# Patient Record
Sex: Female | Born: 1980 | Race: Black or African American | Hispanic: No | Marital: Single | Smoking: Current every day smoker
Health system: Southern US, Community
[De-identification: ages and names within clinical notes are randomized; demographics above are authoritative.]

## PROBLEM LIST (undated history)

## (undated) ENCOUNTER — Ambulatory Visit (HOSPITAL_COMMUNITY): Disposition: A | Discharge status: Home / Self Care | Payer: Medicare Other

## (undated) DIAGNOSIS — F819 Developmental disorder of scholastic skills, unspecified: Secondary | ICD-10-CM

## (undated) DIAGNOSIS — R569 Unspecified convulsions: Secondary | ICD-10-CM

## (undated) HISTORY — PX: TUBAL LIGATION: SHX77

---

## 2018-10-24 ENCOUNTER — Other Ambulatory Visit: Payer: Self-pay

## 2018-10-24 ENCOUNTER — Encounter (HOSPITAL_COMMUNITY): Payer: Self-pay

## 2018-10-24 ENCOUNTER — Emergency Department (HOSPITAL_COMMUNITY): Payer: Medicare Other

## 2018-10-24 ENCOUNTER — Observation Stay (HOSPITAL_COMMUNITY)
Admission: EM | Admit: 2018-10-24 | Discharge: 2018-10-25 | Disposition: A | Discharge status: Home / Self Care | Payer: Medicare Other | Attending: Internal Medicine | Admitting: Internal Medicine

## 2018-10-24 DIAGNOSIS — F17210 Nicotine dependence, cigarettes, uncomplicated: Secondary | ICD-10-CM | POA: Insufficient documentation

## 2018-10-24 DIAGNOSIS — T426X5A Adverse effect of other antiepileptic and sedative-hypnotic drugs, initial encounter: Secondary | ICD-10-CM | POA: Insufficient documentation

## 2018-10-24 DIAGNOSIS — Z79899 Other long term (current) drug therapy: Secondary | ICD-10-CM | POA: Diagnosis not present

## 2018-10-24 DIAGNOSIS — T426X1A Poisoning by other antiepileptic and sedative-hypnotic drugs, accidental (unintentional), initial encounter: Secondary | ICD-10-CM | POA: Diagnosis present

## 2018-10-24 DIAGNOSIS — R2681 Unsteadiness on feet: Secondary | ICD-10-CM | POA: Diagnosis not present

## 2018-10-24 DIAGNOSIS — G92 Toxic encephalopathy: Secondary | ICD-10-CM | POA: Diagnosis not present

## 2018-10-24 DIAGNOSIS — G40909 Epilepsy, unspecified, not intractable, without status epilepticus: Secondary | ICD-10-CM | POA: Diagnosis not present

## 2018-10-24 DIAGNOSIS — G9340 Encephalopathy, unspecified: Secondary | ICD-10-CM | POA: Diagnosis not present

## 2018-10-24 DIAGNOSIS — F819 Developmental disorder of scholastic skills, unspecified: Secondary | ICD-10-CM | POA: Insufficient documentation

## 2018-10-24 HISTORY — DX: Unspecified convulsions: R56.9

## 2018-10-24 HISTORY — DX: Developmental disorder of scholastic skills, unspecified: F81.9

## 2018-10-24 LAB — POC URINE PREG, ED: Preg Test, Ur: NEGATIVE

## 2018-10-24 LAB — URINALYSIS, ROUTINE W REFLEX MICROSCOPIC
Bacteria, UA: NONE SEEN
Bilirubin Urine: NEGATIVE
Glucose, UA: NEGATIVE mg/dL
Hgb urine dipstick: NEGATIVE
Ketones, ur: 20 mg/dL — AB
Leukocytes, UA: NEGATIVE
Nitrite: NEGATIVE
PROTEIN: 30 mg/dL — AB
Specific Gravity, Urine: 1.029 (ref 1.005–1.030)
pH: 5 (ref 5.0–8.0)

## 2018-10-24 LAB — COMPREHENSIVE METABOLIC PANEL
ALT: 14 U/L (ref 0–44)
AST: 17 U/L (ref 15–41)
Albumin: 3.3 g/dL — ABNORMAL LOW (ref 3.5–5.0)
Alkaline Phosphatase: 35 U/L — ABNORMAL LOW (ref 38–126)
Anion gap: 11 (ref 5–15)
BUN: 17 mg/dL (ref 6–20)
CO2: 23 mmol/L (ref 22–32)
Calcium: 9.3 mg/dL (ref 8.9–10.3)
Chloride: 107 mmol/L (ref 98–111)
Creatinine, Ser: 1.1 mg/dL — ABNORMAL HIGH (ref 0.44–1.00)
GFR calc Af Amer: >60 mL/min (ref >60)
GFR calc non Af Amer: >60 mL/min (ref >60)
Glucose, Bld: 110 mg/dL — ABNORMAL HIGH (ref 70–99)
Potassium: 3.7 mmol/L (ref 3.5–5.1)
SODIUM: 141 mmol/L (ref 135–145)
Total Bilirubin: 0.3 mg/dL (ref 0.3–1.2)
Total Protein: 7.3 g/dL (ref 6.5–8.1)

## 2018-10-24 LAB — CBC WITH DIFFERENTIAL/PLATELET
Abs Immature Granulocytes: 0.03 10*3/uL (ref 0.00–0.07)
Basophils Absolute: 0 10*3/uL (ref 0.0–0.1)
Basophils Relative: 0 %
Eosinophils Absolute: 0 10*3/uL (ref 0.0–0.5)
Eosinophils Relative: 0 %
HCT: 45.3 % (ref 36.0–46.0)
Hemoglobin: 14 g/dL (ref 12.0–15.0)
Immature Granulocytes: 0 %
Lymphocytes Relative: 14 %
Lymphs Abs: 1.2 10*3/uL (ref 0.7–4.0)
MCH: 30 pg (ref 26.0–34.0)
MCHC: 30.9 g/dL (ref 30.0–36.0)
MCV: 97.2 fL (ref 80.0–100.0)
Monocytes Absolute: 0.7 10*3/uL (ref 0.1–1.0)
Monocytes Relative: 8 %
NEUTROS PCT: 78 %
NRBC: 0 % (ref 0.0–0.2)
Neutro Abs: 6.8 10*3/uL (ref 1.7–7.7)
Platelets: 112 10*3/uL — ABNORMAL LOW (ref 150–400)
RBC: 4.66 MIL/uL (ref 3.87–5.11)
RDW: 14.6 % (ref 11.5–15.5)
WBC: 8.7 10*3/uL (ref 4.0–10.5)

## 2018-10-24 LAB — ETHANOL: Alcohol, Ethyl (B): <10 mg/dL (ref <10)

## 2018-10-24 LAB — RAPID URINE DRUG SCREEN, HOSP PERFORMED
Amphetamines: NOT DETECTED
Barbiturates: NOT DETECTED
Benzodiazepines: NOT DETECTED
Cocaine: NOT DETECTED
Opiates: NOT DETECTED
Tetrahydrocannabinol: NOT DETECTED

## 2018-10-24 LAB — CBG MONITORING, ED: Glucose-Capillary: 96 mg/dL (ref 70–99)

## 2018-10-24 LAB — CARBAMAZEPINE LEVEL, TOTAL: Carbamazepine Lvl: 10.2 ug/mL (ref 4.0–12.0)

## 2018-10-24 LAB — VALPROIC ACID LEVEL: VALPROIC ACID LVL: 111 ug/mL — AB (ref 50.0–100.0)

## 2018-10-24 MED ORDER — SODIUM CHLORIDE 0.9 % IV BOLUS
1000.0000 mL | Freq: Once | INTRAVENOUS | Status: AC
  Administered 2018-10-24: 1000 mL via INTRAVENOUS

## 2018-10-24 NOTE — ED Triage Notes (Signed)
Patient's sister reports that the patient had a seizure 2 days ago. Patient's sister reports that patient's medication has been changed x 4 months. Patient's sister reports that the patient is able to bathe herself, eat, etc, but in the past two days, has slurred speech, incontinent of urine, unable to walk.

## 2018-10-24 NOTE — ED Provider Notes (Signed)
[facility]-EMERGENCY DEPT Provider Note   CSN: [id number] Arrival date & time: 10/24/18  1831     History   Chief Complaint Chief Complaint  Patient presents with  . Aphasia  . Seizures    HPI Monica Snow is a 37 y.o. female.  The history is provided by the patient and a relative. No language interpreter was used.  Seizures     Monica Snow is a 37 y.o. female who presents to the Emergency Department complaining of aphasia. She presents to the emergency department accompanied by her sister for evaluation of altered mental status. She has a history of seizure disorder and takes Tegretol and Depakote. She has had seizures since the age of 35. A few months ago she came to live with her sister in [place]. Her sister picked up her medications on Thursday and noticed that the doses were different. Before she was on carbamazepine 200 mg twice daily and Depakote 750 mg twice daily. When she picked up the medications she is now on carbamazepine 500 mg twice daily and Depakote 750 mg twice daily. Over the last several days Monica Snow has experienced increased somnolence with difficulty in walking, slurred speech, drooling and urinary and bowel incontinence. Monica Snow reports feeling not well with upset stomach for an unclear period of time. She denies any additional complaints. Sister denies any seizures. Monica Snow does have a history of learning disability and went through the 10th grade. At baseline she is highly functional and helps her sister around the house. Past Medical History:  Diagnosis Date  . Learning disability   . Seizures [facility])     Patient Active Problem List   Diagnosis Date Noted  . 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 Seizure disorder (HCC) 10/24/2018  . Acute encephalopathy 10/24/2018    Past Surgical History:  Procedure Laterality Date  . TUBAL LIGATION       OB History   No obstetric history on file.      Home  Medications    Prior to Admission medications   Medication Sig Start Date End Date Taking? Authorizing Provider  carbamazepine (TEGRETOL) 200 MG tablet Take 400 mg by mouth 2 (two) times daily.   Yes [provider]  divalproex (DEPAKOTE) 250 MG DR tablet Take 750 mg by mouth 2 (two) times daily.   Yes [provider]  carbamazepine (TEGRETOL XR) 100 MG 12 hr tablet Take 100 mg by mouth 2 (two) times daily.    [provider]  levETIRAcetam (KEPPRA) 500 MG tablet Take 500 mg by mouth 2 (two) times daily.    [provider]    Family History Family History  Problem Relation Age of Onset  . Brain cancer Mother   . Seizures Father   . Hypertension Father   . Stroke Neg Hx   . Diabetes Neg Hx     Social History Social History   Tobacco Use  . Smoking status: Current Every Day Smoker    Packs/day: 0.25    Types: Cigarettes  . Smokeless tobacco: Never Used  Substance Use Topics  . Alcohol use: Never    Frequency: Never  . Drug use: Never     Allergies   Patient has no known allergies.   Review of Systems Review of Systems  Neurological: Positive for seizures.  All other systems reviewed and are negative.    Physical Exam Updated Vital Signs BP 125/75 (BP Location: Right Arm)   Pulse 70  Temp 98.5 F (36.9 C) (Oral)   Resp 15   Ht 5\' 4"  (1.626 m)   Wt 52.2 kg   LMP 10/24/2018   SpO2 100%   BMI 19.74 kg/m   Physical Exam Vitals signs and nursing note reviewed.  Constitutional:      Appearance: She is well-developed.  HENT:     Head: Normocephalic and atraumatic.  Cardiovascular:     Rate and Rhythm: Normal rate and regular rhythm.     Heart sounds: No murmur.  Pulmonary:     Effort: Pulmonary effort is normal. No respiratory distress.     Breath sounds: Normal breath sounds.  Abdominal:     Palpations: Abdomen is soft.     Tenderness: There is no abdominal tenderness. There is no guarding or rebound.   Musculoskeletal:        General: No tenderness.  Skin:    General: Skin is warm and dry.  Neurological:     Mental Status: She is alert.     Comments: Drowsy but arouses to verbal stimuli. Slow to answer questions. Moderately confused. No facial asymmetry. Five out of five strength in all four extremities.  Psychiatric:        Behavior: Behavior normal.      ED Treatments / Results  Labs (all labs ordered are listed, but only abnormal results are displayed) Labs Reviewed  COMPREHENSIVE METABOLIC PANEL - Abnormal; Notable for the following components:      Result Value   Glucose, Bld 110 (*)    Creatinine, Ser 1.10 (*)    Albumin 3.3 (*)    Alkaline Phosphatase 35 (*)    All other components within normal limits  URINALYSIS, ROUTINE W REFLEX MICROSCOPIC - Abnormal; Notable for the following components:   Ketones, ur 20 (*)    Protein, ur 30 (*)    All other components within normal limits  CBC WITH DIFFERENTIAL/PLATELET - Abnormal; Notable for the following components:   Platelets 112 (*)    All other components within normal limits  VALPROIC ACID LEVEL - Abnormal; Notable for the following components:   Valproic Acid Lvl 111 (*)    All other components within normal limits  ETHANOL  RAPID URINE DRUG SCREEN, HOSP PERFORMED  CARBAMAZEPINE LEVEL, TOTAL  HIV ANTIBODY (ROUTINE TESTING W REFLEX)  MAGNESIUM  PHOSPHORUS  TSH  COMPREHENSIVE METABOLIC PANEL  CBC  POC URINE PREG, ED  CBG MONITORING, ED    EKG None  Radiology Ct Head Wo Contrast  Result Date: 10/24/2018 CLINICAL DATA:  Altered mental status EXAM: CT HEAD WITHOUT CONTRAST TECHNIQUE: Contiguous axial images were obtained from the base of the skull through the vertex without intravenous contrast. COMPARISON:  None. FINDINGS: Brain: Cerebellar atrophy. No acute intracranial abnormality. Specifically, no hemorrhage, hydrocephalus, mass lesion, acute infarction, or significant intracranial injury. Vascular: No  hyperdense vessel or unexpected calcification. Skull: No acute calvarial abnormality. Sinuses/Orbits: Visualized paranasal sinuses and mastoids clear. Orbital soft tissues unremarkable. Other: None IMPRESSION: Cerebellar atrophy.  No acute intracranial abnormality. Electronically Signed   By: Charlett Nose M.D.   On: 10/24/2018 20:00    Procedures Procedures (including critical care time)  Medications Ordered in ED Medications  carbamazepine (TEGRETOL XR) 12 hr tablet 200 mg (has no administration in time range)  acetaminophen (TYLENOL) tablet 650 mg (has no administration in time range)    Or  acetaminophen (TYLENOL) suppository 650 mg (has no administration in time range)  ondansetron (ZOFRAN) tablet 4 mg (has no administration  in time range)    Or  ondansetron (ZOFRAN) injection 4 mg (has no administration in time range)  0.9 %  sodium chloride infusion (has no administration in time range)  sodium chloride 0.9 % bolus 1,000 mL (0 mLs Intravenous Stopped 10/24/18 2141)     Initial Impression / Assessment and Plan / ED Course  I have reviewed the triage vital signs and the nursing notes.  Pertinent labs & imaging results that were available during my care of the patient were reviewed by me and considered in my medical decision making (see chart for details).     Patient with history of seizure disorder here for evaluation of increased lethargy, drooling, weakness and incontinence that is been present for the last several days after increased dose in her seizure medications. She is non-toxic appearing on evaluation, confused but nonfocal. There is concern that she is a super therapeutic on her seizure medications contributing to her encephalopathy. Hospitalist consulted for admission for observation.  Final Clinical Impressions(s) / ED Diagnoses   Final diagnoses:  None    ED Discharge Orders    None       Tilden Fossaees, Esmee Fallaw, MD 10/25/18 0102

## 2018-10-24 NOTE — ED Notes (Addendum)
[id number] Eduard Closiffanie (sister)  [phone] Lowella Bandyikki [facility](Aunt)   Call if anything is needed.

## 2018-10-24 NOTE — ED Notes (Signed)
Pt informed that a urine sample is still needed, but pt stated "I can't pee right now".

## 2018-10-24 NOTE — ED Notes (Signed)
ED TO INPATIENT HANDOFF REPORT  Name/Age/Gender Monica Snow 37 y.o. female  Code Status   Home/SNF/Other Home  Chief Complaint Seizures;Aphasia  Level of Care/Admitting Diagnosis ED Disposition    ED Disposition Condition Comment   Admit  Hospital Area: [facility] [[id number]]  Level of Care: Telemetry [5]  Admit to tele based on following criteria: Monitor QTC interval  Diagnosis: Valproic acid toxicity [308657]  Admitting Physician: Therisa Doyne [[id number]]  Attending Physician: Therisa Doyne [[id number]]  PT Class (Do Not Modify): Observation [104]  PT Acc Code (Do Not Modify): Observation [10022]       Medical History Past Medical History:  Diagnosis Date  . Learning disability   . Seizures (HCC)     Allergies No Known Allergies  IV Location/Drains/Wounds Patient Lines/Drains/Airways Status   Active Line/Drains/Airways    Name:   Placement date:   Placement time:   Site:   Days:   Peripheral IV 10/24/18 Right Antecubital   10/24/18    1944    Antecubital   less than 1   External Urinary Catheter   10/24/18    1931    -   less than 1          Labs/Imaging Results for orders placed or performed during the hospital encounter of 10/24/18 (from the past 48 hour(s))  CBG monitoring, ED     Status: None   Collection Time: 10/24/18  7:17 PM  Result Value Ref Range   Glucose-Capillary 96 70 - 99 mg/dL  Comprehensive metabolic panel     Status: Abnormal   Collection Time: 10/24/18  7:32 PM  Result Value Ref Range   Sodium 141 135 - 145 mmol/L   Potassium 3.7 3.5 - 5.1 mmol/L   Chloride 107 98 - 111 mmol/L   CO2 23 22 - 32 mmol/L   Glucose, Bld 110 (H) 70 - 99 mg/dL   BUN 17 6 - 20 mg/dL   Creatinine, Ser 8.46 (H) 0.44 - 1.00 mg/dL   Calcium 9.3 8.9 - 96.2 mg/dL   Total Protein 7.3 6.5 - 8.1 g/dL   Albumin 3.3 (L) 3.5 - 5.0 g/dL   AST 17 15 - 41 U/L   ALT 14 0 - 44 U/L   Alkaline Phosphatase 35 (L) 38 - 126 U/L   Total  Bilirubin 0.3 0.3 - 1.2 mg/dL   GFR calc non Af Amer >60 >60 mL/min   GFR calc Af Amer >60 >60 mL/min   Anion gap 11 5 - 15    Comment: Performed at [facility], [address] [place], [place] [address]  Ethanol     Status: None   Collection Time: 10/24/18  7:32 PM  Result Value Ref Range   Alcohol, Ethyl (B) <10 <10 mg/dL    Comment: (NOTE) Lowest detectable limit for serum alcohol is 10 mg/dL. For medical purposes only. Performed at [facility], [address], [place], [place] [address]   Urinalysis, Routine w reflex microscopic     Status: Abnormal   Collection Time: 10/24/18  7:32 PM  Result Value Ref Range   Color, Urine YELLOW YELLOW   APPearance CLEAR CLEAR   Specific Gravity, Urine 1.029 1.005 - 1.030   pH 5.0 5.0 - 8.0   Glucose, UA NEGATIVE NEGATIVE mg/dL   Hgb urine dipstick NEGATIVE NEGATIVE   Bilirubin Urine NEGATIVE NEGATIVE   Ketones, ur 20 (A) NEGATIVE mg/dL   Protein, ur 30 (A) NEGATIVE mg/dL  Nitrite NEGATIVE NEGATIVE   Leukocytes, UA NEGATIVE NEGATIVE   RBC / HPF 0-5 0 - 5 RBC/hpf   WBC, UA 0-5 0 - 5 WBC/hpf   Bacteria, UA NONE SEEN NONE SEEN   Squamous Epithelial / LPF 0-5 0 - 5   Mucus PRESENT     Comment: Performed at [facility], [address], [place], [place] [address]  Urine rapid drug screen (hosp performed)     Status: None   Collection Time: 10/24/18  7:32 PM  Result Value Ref Range   Opiates NONE DETECTED NONE DETECTED   Cocaine NONE DETECTED NONE DETECTED   Benzodiazepines NONE DETECTED NONE DETECTED   Amphetamines NONE DETECTED NONE DETECTED   Tetrahydrocannabinol NONE DETECTED NONE DETECTED   Barbiturates NONE DETECTED NONE DETECTED    Comment: (NOTE) DRUG SCREEN FOR MEDICAL PURPOSES ONLY.  IF CONFIRMATION IS NEEDED FOR ANY PURPOSE, NOTIFY LAB WITHIN 5 DAYS. LOWEST DETECTABLE LIMITS FOR URINE DRUG SCREEN Drug Class                     Cutoff (ng/mL) Amphetamine and  metabolites    1000 Barbiturate and metabolites    200 Benzodiazepine                 200 Tricyclics and metabolites     300 Opiates and metabolites        300 Cocaine and metabolites        300 THC                            50 Performed at [facility], [address], [place], [place] [address]   CBC with Differential     Status: Abnormal   Collection Time: 10/24/18  7:32 PM  Result Value Ref Range   WBC 8.7 4.0 - 10.5 K/uL   RBC 4.66 3.87 - 5.11 MIL/uL   Hemoglobin 14.0 12.0 - 15.0 g/dL   HCT 09.8 11.9 - 14.7 %   MCV 97.2 80.0 - 100.0 fL   MCH 30.0 26.0 - 34.0 pg   MCHC 30.9 30.0 - 36.0 g/dL   RDW 82.9 56.2 - 13.0 %   Platelets 112 (L) 150 - 400 K/uL    Comment: REPEATED TO VERIFY PLATELET COUNT CONFIRMED BY SMEAR SPECIMEN CHECKED FOR CLOTS Immature Platelet Fraction may be clinically indicated, consider ordering this additional test [id number]    nRBC 0.0 0.0 - 0.2 %   Neutrophils Relative % 78 %   Neutro Abs 6.8 1.7 - 7.7 K/uL   Lymphocytes Relative 14 %   Lymphs Abs 1.2 0.7 - 4.0 K/uL   Monocytes Relative 8 %   Monocytes Absolute 0.7 0.1 - 1.0 K/uL   Eosinophils Relative 0 %   Eosinophils Absolute 0.0 0.0 - 0.5 K/uL   Basophils Relative 0 %   Basophils Absolute 0.0 0.0 - 0.1 K/uL   Immature Granulocytes 0 %   Abs Immature Granulocytes 0.03 0.00 - 0.07 K/uL    Comment: Performed at [facility], [address], [place], [place] [address]  Valproic acid level     Status: Abnormal   Collection Time: 10/24/18  7:32 PM  Result Value Ref Range   Valproic Acid Lvl 111 (H) 50.0 - 100.0 ug/mL    Comment: Performed at [facility], [address] [place], [place] [address]  Carbamazepine level, total     Status:  None   Collection Time: 10/24/18  7:32 PM  Result Value Ref Range   Carbamazepine Lvl 10.2 4.0 - 12.0 ug/mL    Comment: Performed at [facility], [address] [place], [place] [address]  POC  urine preg, ED     Status: None   Collection Time: 10/24/18 10:02 PM  Result Value Ref Range   Preg Test, Ur NEGATIVE NEGATIVE    Comment:        THE SENSITIVITY OF THIS METHODOLOGY IS >24 mIU/mL    Ct Head Wo Contrast  Result Date: 10/24/2018 CLINICAL DATA:  Altered mental status EXAM: CT HEAD WITHOUT CONTRAST TECHNIQUE: Contiguous axial images were obtained from the base of the skull through the vertex without intravenous contrast. COMPARISON:  None. FINDINGS: Brain: Cerebellar atrophy. No acute intracranial abnormality. Specifically, no hemorrhage, hydrocephalus, mass lesion, acute infarction, or significant intracranial injury. Vascular: No hyperdense vessel or unexpected calcification. Skull: No acute calvarial abnormality. Sinuses/Orbits: Visualized paranasal sinuses and mastoids clear. Orbital soft tissues unremarkable. Other: None IMPRESSION: Cerebellar atrophy.  No acute intracranial abnormality. Electronically Signed   By: Charlett NoseKevin  Dover M.D.   On: 10/24/2018 20:00   None  Pending Labs Unresulted Labs (From admission, onward)   None      Vitals/Pain Today's Vitals   10/24/18 2130 10/24/18 2200 10/24/18 2230 10/24/18 2300  BP: (!) 138/100 (!) 124/94 (!) 144/98 134/87  Pulse: 66 71 71 68  Resp: 16 13 19 14   Temp:      TempSrc:      SpO2: 99% 100% 100% 100%  Weight:      Height:      PainSc:        Isolation Precautions No active isolations  Medications Medications  sodium chloride 0.9 % bolus 1,000 mL (0 mLs Intravenous Stopped 10/24/18 2141)    Mobility walks

## 2018-10-24 NOTE — ED Notes (Signed)
ED Provider at bedside. 

## 2018-10-24 NOTE — H&P (Addendum)
Monica AlarStephanie Snow ZOX:[id number] DOB: 04-Dec-1980 DOA: 10/24/2018     PCP: [facility] [place]  Outpatient Specialists:   Neurology Dr. [facility] [place] [facility]   Patient arrived to ER on 10/24/18 at 1831  Patient coming from: home Lives  With family    Chief Complaint:  Chief Complaint  Patient presents with  . Aphasia  . Seizures    HPI: Monica Snow is a [age] y.o. female with medical history significant of seizure disorder     Presented with seizure episode 2 days ago her medication has been changed over the past 4 months for the past 2 days she has been having slurred speech incontinent of urine and unable to walk. He has known history of seizure disorder for which she is on Tegretol and Depakote been having seizures since 14. She has been on Tegretol 100 mg BID still had seizures frequently twice a month. They increased dose to 200 mg BID she was having seizures even more frequently. Then her dose went up to 500 mg BID family filled prescription since Thursday.  Recently her Tegretol was increased from 200 mg twice a day to 500 mg twice a day her Depakote dose remain at 750 mg  twice a day patient has been progressively getting more sleepy difficulty with ambulation slurred speech drooling urinary and bowel incontinence. Patient has reported some abdominal cramping but she recently started on her period otherwise no fevers or chills She does have learning disability at baseline but able to take care of herself and help around the house.   Regarding pertinent Chronic problems: seizure disorder usually has been taking care of by neurology at [place] [facility] [place] taken [place] of by her sister prior to this she was in care of her father and stepmother but father has passed away.  Sister is unsure about past history of medical management.    While in ER:  The following Work up has been ordered so far:  Orders Placed This Encounter  Procedures  . CT  Head Wo Contrast  . Comprehensive metabolic panel  . Ethanol  . Urinalysis, Routine w reflex microscopic  . Urine rapid drug screen (hosp performed)  . CBC with Differential  . Valproic acid level  . Carbamazepine level, total  . Cardiac monitoring  . Consult to hospitalist  . POC urine preg, ED  . CBG monitoring, ED  . ED EKG  . EKG 12-Lead  . EKG 12-Lead     Following Medications were ordered in ER: Medications  sodium chloride 0.9 % bolus 1,000 mL (0 mLs Intravenous Stopped 10/24/18 2141)    Significant initial  Findings: Abnormal Labs Reviewed  COMPREHENSIVE METABOLIC PANEL - Abnormal; Notable for the following components:      Result Value   Glucose, Bld 110 (*)    Creatinine, Ser 1.10 (*)    Albumin 3.3 (*)    Alkaline Phosphatase 35 (*)    All other components within normal limits  URINALYSIS, ROUTINE W REFLEX MICROSCOPIC - Abnormal; Notable for the following components:   Ketones, ur 20 (*)    Protein, ur 30 (*)    All other components within normal limits  CBC WITH DIFFERENTIAL/PLATELET - Abnormal; Notable for the following components:   Platelets 112 (*)    All other components within normal limits  VALPROIC ACID LEVEL - Abnormal; Notable for the following components:   Valproic Acid Lvl 111 (*)    All other components within  normal limits     Lactic Acid, Venous No results found for: LATICACIDVEN  Na  141 K 3.7  Cr    Lab Results  Component Value Date   CREATININE 1.10 (H) 10/24/2018      WBC  8.7  HG/HCT     Component Value Date/Time   HGB 14.0 10/24/2018 1932   HCT 45.3 10/24/2018 1932     Valproate 111 CBMZ 10   UA  no evidence of UTI     CXR - NON acute  ECG:  Personally reviewed by me showing: HR : 62 Rhythm: NSR,   no evidence of ischemic changes QTC 392   ED Triage Vitals  Enc Vitals Group     BP 10/24/18 1854 (!) 141/102     Pulse Rate 10/24/18 1854 62     Resp 10/24/18 1854 (!) 9     Temp 10/24/18 1854 98.9 F  (37.2 C)     Temp Source 10/24/18 1854 Oral     SpO2 10/24/18 1854 100 %     Weight 10/24/18 1849 115 lb (52.2 kg)     Height 10/24/18 1849 5\' 4"  (1.626 m)     Head Circumference --      Peak Flow --      Pain Score 10/24/18 1849 0     Pain Loc --      Pain Edu? --      Excl. in GC? --   TMAX(24)@       Latest  Blood pressure (!) 144/98, pulse 71, temperature 98.9 F (37.2 C), temperature source Oral, resp. rate 19, height 5\' 4"  (1.626 m), weight 52.2 kg, last menstrual period 10/24/2018, SpO2 100 %.     Hospitalist was called for admission for valproic acid toxicity in the setting of difficult to manage seizure disorder   Review of Systems:    Pertinent positives include:  fatigue drooling, slurred speech confusion Constitutional:  No weight loss, night sweats, Fevers, chills,  weight loss  HEENT:  No headaches, Difficulty swallowing,Tooth/dental problems,Sore throat,  No sneezing, itching, ear ache, nasal congestion, post nasal drip,  Cardio-vascular:  No chest pain, Orthopnea, PND, anasarca, dizziness, palpitations.no Bilateral lower extremity swelling  GI:  No heartburn, indigestion, abdominal pain, nausea, vomiting, diarrhea, change in bowel habits, loss of appetite, melena, blood in stool, hematemesis Resp:  no shortness of breath at rest. No dyspnea on exertion, No excess mucus, no productive cough, No non-productive cough, No coughing up of blood.No change in color of mucus.No wheezing. Skin:  no rash or lesions. No jaundice GU:  no dysuria, change in color of urine, no urgency or frequency. No straining to urinate.  No flank pain.  Musculoskeletal:  No joint pain or no joint swelling. No decreased range of motion. No back pain.  Psych:  No change in mood or affect. No depression or anxiety. No memory loss.  Neuro: no localizing neurological complaints, no tingling, no weakness, no double vision, no gait abnormality,   All systems reviewed and apart from HOPI  all are negative  Past Medical History:   Past Medical History:  Diagnosis Date  . Learning disability   . Seizures (HCC)       Past Surgical History:  Procedure Laterality Date  . TUBAL LIGATION      Social History:  Ambulatory   independently       reports that she has been smoking cigarettes. She has been smoking about 0.25 packs per day. She has never  used smokeless tobacco. She reports that she does not drink alcohol or use drugs.     Family History:   Family History  Problem Relation Age of Onset  . Brain cancer Mother   . Seizures Father   . Hypertension Father   . Stroke Neg Hx   . Diabetes Neg Hx     Allergies: No Known Allergies   Prior to Admission medications   Medication Sig Start Date End Date Taking? Authorizing Provider  carbamazepine (TEGRETOL) 200 MG tablet Take 400 mg by mouth 2 (two) times daily.   Yes [provider]  divalproex (DEPAKOTE) 250 MG DR tablet Take 750 mg by mouth 2 (two) times daily.   Yes [provider]  carbamazepine (TEGRETOL XR) 100 MG 12 hr tablet Take 100 mg by mouth 2 (two) times daily.    [provider]  levETIRAcetam (KEPPRA) 500 MG tablet Take 500 mg by mouth 2 (two) times daily.    [provider]   Physical Exam: Blood pressure (!) 144/98, pulse 71, temperature 98.9 F (37.2 C), temperature source Oral, resp. rate 19, height 5\' 4"  (1.626 m), weight 52.2 kg, last menstrual period 10/24/2018, SpO2 100 %. 1. General:  in No Acute distress  Chronically ill  -appearing 2. Psychological: Alert and Oriented 3. Head/ENT:   Moist Mucous Membranes                          Head Non traumatic, neck supple                             Poor Dentition 4. SKIN:  decreased Skin turgor,  Skin clean Dry and intact no rash 5. Heart: Regular rate and rhythm no  Murmur, no Rub or gallop 6. Lungs clear to auscultation bilaterally, no wheezes or crackles   7. Abdomen: Soft,  non-tender, Non  distended bowel sounds present 8. Lower extremities: no clubbing, cyanosis, or edema 9. Neurologically Grossly intact, moving all 4 extremities equally  10. MSK: Normal range of motion   LABS:     Recent Labs  Lab 10/24/18 1932  WBC 8.7  NEUTROABS 6.8  HGB 14.0  HCT 45.3  MCV 97.2  PLT 112*   Basic Metabolic Panel: Recent Labs  Lab 10/24/18 1932  NA 141  K 3.7  CL 107  CO2 23  GLUCOSE 110*  BUN 17  CREATININE 1.10*  CALCIUM 9.3      Recent Labs  Lab 10/24/18 1932  AST 17  ALT 14  ALKPHOS 35*  BILITOT 0.3  PROT 7.3  ALBUMIN 3.3*   No results for input(s): LIPASE, AMYLASE in the last 168 hours. No results for input(s): AMMONIA in the last 168 hours.    HbA1C: No results for input(s): HGBA1C in the last 72 hours. CBG: Recent Labs  Lab 10/24/18 1917  GLUCAP 96      Urine analysis:    Component Value Date/Time   COLORURINE YELLOW 10/24/2018 1932   APPEARANCEUR CLEAR 10/24/2018 1932   LABSPEC 1.029 10/24/2018 1932   PHURINE 5.0 10/24/2018 1932   GLUCOSEU NEGATIVE 10/24/2018 1932   HGBUR NEGATIVE 10/24/2018 1932   BILIRUBINUR NEGATIVE 10/24/2018 1932   KETONESUR 20 (A) 10/24/2018 1932   PROTEINUR 30 (A) 10/24/2018 1932   NITRITE NEGATIVE 10/24/2018 1932   LEUKOCYTESUR NEGATIVE 10/24/2018 1932       Cultures: No results found for: SDES, SPECREQUEST,  CULT, REPTSTATUS   Radiological Exams on Admission: Ct Head Wo Contrast  Result Date: 10/24/2018 CLINICAL DATA:  Altered mental status EXAM: CT HEAD WITHOUT CONTRAST TECHNIQUE: Contiguous axial images were obtained from the base of the skull through the vertex without intravenous contrast. COMPARISON:  None. FINDINGS: Brain: Cerebellar atrophy. No acute intracranial abnormality. Specifically, no hemorrhage, hydrocephalus, mass lesion, acute infarction, or significant intracranial injury. Vascular: No hyperdense vessel or unexpected calcification. Skull: No acute calvarial abnormality.  Sinuses/Orbits: Visualized paranasal sinuses and mastoids clear. Orbital soft tissues unremarkable. Other: None IMPRESSION: Cerebellar atrophy.  No acute intracranial abnormality. Electronically Signed   By: Charlett Nose M.D.   On: 10/24/2018 20:00    Chart has been reviewed    Assessment/Plan  37 y.o. female with medical history significant of seizure disorder      Admitted for encephalopathy in the setting of valproic acid toxicity  Present on Admission: . Valproic acid toxicity- Plan to observe patient overnight continue Tegretol at 200 mg twice daily hold off of Depakote for tonight.  Tomorrow recheck Depakote level and if level has come down to normal range restart Depakote at 500 twice daily.  Patient will need follow-up with [place] neurology to establish care in the near future given difficult to manage seizure disorder. Will request records from [facility]  Monitor on telemetry Repeat EKG  . Acute encephalopathy setting of elevated valproic acid give recent changes in medications could be that valproic acid metabolism has been affected by titration of Tegretol   Learning disability -   at baseline able to take care of self  Other plan as per orders.  DVT prophylaxis:  SCD     Code Status:  FULL CODE  as per family  I had personally discussed CODE STATUS with  family  Family Communication:   Family not at  Bedside  plan of care was discussed with   Sister on the phone  Disposition Plan:      Would benefit from PT/OT eval prior to DC  Ordered                                   Consults called: Spoke with neurology please reconsult if need further questions answered  Admission status:  Obs    Level of care   tele  For 12H      Deonte Otting 10/25/2018, 12:09 AM    [facility]  Pager [phone]   after 2 AM please page floor coverage PA If 7AM-7PM, please contact the day team taking care of the patient  [email or web address]  Password [email or web address]

## 2018-10-25 ENCOUNTER — Encounter (HOSPITAL_COMMUNITY): Payer: Self-pay | Admitting: Internal Medicine

## 2018-10-25 DIAGNOSIS — T426X1A Poisoning by other antiepileptic and sedative-hypnotic drugs, accidental (unintentional), initial encounter: Secondary | ICD-10-CM | POA: Diagnosis not present

## 2018-10-25 DIAGNOSIS — G92 Toxic encephalopathy: Secondary | ICD-10-CM | POA: Diagnosis not present

## 2018-10-25 DIAGNOSIS — T426X5A Adverse effect of other antiepileptic and sedative-hypnotic drugs, initial encounter: Secondary | ICD-10-CM | POA: Diagnosis not present

## 2018-10-25 DIAGNOSIS — G9340 Encephalopathy, unspecified: Secondary | ICD-10-CM | POA: Diagnosis not present

## 2018-10-25 DIAGNOSIS — G40909 Epilepsy, unspecified, not intractable, without status epilepticus: Secondary | ICD-10-CM | POA: Diagnosis not present

## 2018-10-25 DIAGNOSIS — F819 Developmental disorder of scholastic skills, unspecified: Secondary | ICD-10-CM | POA: Diagnosis not present

## 2018-10-25 LAB — COMPREHENSIVE METABOLIC PANEL
ALT: 13 U/L (ref 0–44)
AST: 12 U/L — ABNORMAL LOW (ref 15–41)
Albumin: 2.8 g/dL — ABNORMAL LOW (ref 3.5–5.0)
Alkaline Phosphatase: 30 U/L — ABNORMAL LOW (ref 38–126)
Anion gap: 9 (ref 5–15)
BUN: 18 mg/dL (ref 6–20)
CO2: 24 mmol/L (ref 22–32)
Calcium: 8.7 mg/dL — ABNORMAL LOW (ref 8.9–10.3)
Chloride: 107 mmol/L (ref 98–111)
Creatinine, Ser: 0.94 mg/dL (ref 0.44–1.00)
GFR calc Af Amer: >60 mL/min (ref >60)
GFR calc non Af Amer: >60 mL/min (ref >60)
Glucose, Bld: 81 mg/dL (ref 70–99)
Potassium: 3.5 mmol/L (ref 3.5–5.1)
SODIUM: 140 mmol/L (ref 135–145)
Total Bilirubin: 0.5 mg/dL (ref 0.3–1.2)
Total Protein: 6.2 g/dL — ABNORMAL LOW (ref 6.5–8.1)

## 2018-10-25 LAB — TSH: TSH: 1.526 u[IU]/mL (ref 0.350–4.500)

## 2018-10-25 LAB — VALPROIC ACID LEVEL: Valproic Acid Lvl: 49 ug/mL — ABNORMAL LOW (ref 50.0–100.0)

## 2018-10-25 LAB — CBC
HEMATOCRIT: 38.6 % (ref 36.0–46.0)
HEMOGLOBIN: 12.1 g/dL (ref 12.0–15.0)
MCH: 30.6 pg (ref 26.0–34.0)
MCHC: 31.3 g/dL (ref 30.0–36.0)
MCV: 97.7 fL (ref 80.0–100.0)
Platelets: 93 10*3/uL — ABNORMAL LOW (ref 150–400)
RBC: 3.95 MIL/uL (ref 3.87–5.11)
RDW: 14.6 % (ref 11.5–15.5)
WBC: 8.5 10*3/uL (ref 4.0–10.5)
nRBC: 0 % (ref 0.0–0.2)

## 2018-10-25 LAB — HIV ANTIBODY (ROUTINE TESTING W REFLEX): HIV Screen 4th Generation wRfx: NONREACTIVE

## 2018-10-25 LAB — MAGNESIUM: Magnesium: 2 mg/dL (ref 1.7–2.4)

## 2018-10-25 LAB — PHOSPHORUS: PHOSPHORUS: 3.2 mg/dL (ref 2.5–4.6)

## 2018-10-25 MED ORDER — ACETAMINOPHEN 325 MG PO TABS: 650.0000 mg | ORAL_TABLET | Freq: Four times a day (QID) | ORAL | Status: DC | PRN

## 2018-10-25 MED ORDER — ONDANSETRON HCL 4 MG/2ML IJ SOLN: 4.0000 mg | Freq: Four times a day (QID) | INTRAMUSCULAR | Status: DC | PRN

## 2018-10-25 MED ORDER — CARBAMAZEPINE ER 200 MG PO TB12
200.0000 mg | ORAL_TABLET | Freq: Two times a day (BID) | ORAL | Status: DC
  Administered 2018-10-25 (×2): 200 mg via ORAL
  Filled 2018-10-25 (×2): qty 1

## 2018-10-25 MED ORDER — ONDANSETRON HCL 4 MG PO TABS: 4.0000 mg | ORAL_TABLET | Freq: Four times a day (QID) | ORAL | Status: DC | PRN

## 2018-10-25 MED ORDER — ACETAMINOPHEN 650 MG RE SUPP: 650.0000 mg | Freq: Four times a day (QID) | RECTAL | Status: DC | PRN

## 2018-10-25 MED ORDER — SODIUM CHLORIDE 0.9 % IV SOLN
INTRAVENOUS | Status: AC
  Administered 2018-10-25: 01:00:00 via INTRAVENOUS

## 2018-10-25 MED ORDER — DIVALPROEX SODIUM 500 MG PO DR TAB: 500.0000 mg | DELAYED_RELEASE_TABLET | Freq: Two times a day (BID) | ORAL | 0 refills | Status: DC

## 2018-10-25 NOTE — Progress Notes (Signed)
Pt arrived to unit via stretcher. Alert and oriented x2. VS taken. Pt has learning disability. No family at bedside. General weakness and slurred speech. No complaint of pain. Initial assessment completed will continue to monitor

## 2018-10-25 NOTE — Evaluation (Signed)
Physical Therapy Evaluation Patient Details Name: Monica Snow MRN: [id number] DOB: 08-03-1981 Today's Date: 10/25/2018   History of Present Illness  Monica Snow is a 37 y.o. female with medical history significant of seizure disorder, learning disability presented with excessive somnolence, slurred speech, gait instability, urinary/bowel incontinence for the past couple of days.   Clinical Impression  Patient evaluated by Physical Therapy with no further acute PT needs identified. All education has been completed and the patient has no further questions.  See below for any follow-up Physical Therapy or equipment needs. PT is signing off. Thank you for this referral.    Follow Up Recommendations No PT follow up    Equipment Recommendations  None recommended by PT    Recommendations for Other Services       Precautions / Restrictions Precautions Precautions: Fall      Mobility  Bed Mobility   Bed Mobility: Supine to Sit     Supine to sit: Modified independent (Device/Increase time)        Transfers Overall transfer level: Needs assistance Equipment used: None Transfers: Sit to/from Stand Sit to Stand: Supervision;Modified independent (Device/Increase time)         General transfer comment: supervision for safety  Ambulation/Gait Ambulation/Gait assistance: Supervision;Min guard Gait Distance (Feet): 260 Feet Assistive device: None Gait Pattern/deviations: Drifts right/left;Decreased stride length     General Gait Details: pt with mild unsteadiness initially, improved with incr distance, slight drifting but without overt LOB (pt reports gait is at her baseline)  Careers information officer    Modified Rankin (Stroke Patients Only)       Balance Overall balance assessment: Needs assistance(denies any falls)   Sitting balance-Leahy Scale: Good       Standing balance-Leahy Scale: Fair Standing balance comment: pt is able to  tol min challenges, balance reactions delayed              High level balance activites: Side stepping;Turns;Head turns High Level Balance Comments: no LOB with above, min/guard to supervision for safety             Pertinent Vitals/Pain Pain Assessment: No/denies pain    Home Living Family/patient expects to be discharged to:: Private residence Living Arrangements: Other relatives(sister - Monica Snow) Available Help at Discharge: Family Type of Home: House       Home Layout: One level Home Equipment: None      Prior Function Level of Independence: Independent         Comments: pt reports IND prior to admission, denies falls; states her sister sleeps " a lot" and she watches her sister's kids while she sleeps     Hand Dominance        Extremity/Trunk Assessment   Upper Extremity Assessment Upper Extremity Assessment: Generalized weakness    Lower Extremity Assessment Lower Extremity Assessment: Generalized weakness(appears to have some basline muscle atrophy, mild hypotonia trunk and LEs)       Communication   Communication: No difficulties  Cognition Arousal/Alertness: Awake/alert Behavior During Therapy: WFL for tasks assessed/performed Overall Cognitive Status: Within Functional Limits for tasks assessed                                 General Comments: hx of "learning disability", pt answers questions and responds appropriately during PT eval, Ox4      General Comments  Exercises     Assessment/Plan    PT Assessment Patent does not need any further PT services  PT Problem List         PT Treatment Interventions      PT Goals (Current goals can be found in the Care Plan section)  Acute Rehab PT Goals PT Goal Formulation: All assessment and education complete, DC therapy    Frequency     Barriers to discharge        Co-evaluation               AM-PAC PT "6 Clicks" Mobility  Outcome Measure Help needed  turning from your back to your side while in a flat bed without using bedrails?: None Help needed moving from lying on your back to sitting on the side of a flat bed without using bedrails?: None Help needed moving to and from a bed to a chair (including a wheelchair)?: None Help needed standing up from a chair using your arms (e.g., wheelchair or bedside chair)?: None Help needed to walk in hospital room?: A Little Help needed climbing 3-5 steps with a railing? : A Little 6 Click Score: 22    End of Session Equipment Utilized During Treatment: Gait belt Activity Tolerance: Patient tolerated treatment well Patient left: in bed;with call bell/phone within reach;with bed alarm set;with family/visitor present Nurse Communication: Other (comment)(ready for d/c) PT Visit Diagnosis: Unsteadiness on feet (R26.81)    Time: [id number] PT Time Calculation (min) (ACUTE ONLY): 21 min   Charges:   PT Evaluation $PT Eval Low Complexity: 1 Low          Drucilla Chaletara Lyndie Vanderloop, PT  Pager: [phone] Acute Rehab Dept [facility]): [phone]   10/25/2018   [facility],Monica Snow 10/25/2018, 4:56 PM

## 2018-10-25 NOTE — Discharge Summary (Signed)
Discharge Summary  Monica AlarStephanie Frith WUJ:[id number] DOB: 1981/02/28  PCP: Patient, No Pcp Per  Admit date: 10/24/2018 Discharge date: 10/25/2018  Time spent: 30 mins  Recommendations for Outpatient Follow-up:  1. Pt advised to establish care with [facility] 2. Pt advised to establish care with a PCP  Discharge Diagnoses:  Active Hospital Problems   Diagnosis Date Noted  . 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 Seizure disorder (HCC) 10/24/2018  . Acute encephalopathy 10/24/2018    Resolved Hospital Problems  No resolved problems to display.    Discharge Condition: Stable  Diet recommendation: Regular diet  Vitals:   10/25/18 0603 10/25/18 1402  BP: 119/84 113/77  Pulse: 65 88  Resp: 16 18  Temp: 98.2 F (36.8 C) 98.7 F (37.1 C)  SpO2: 100% 100%    History of present illness:  Monica Snow is a 37 y.o. female with medical history significant of seizure disorder, learning disability presented with excessive somnolence, slurred speech, gait instability, urinary/bowel incontinence for the past couple of days. Sister who is a primary care-giver denies any witnessed seizure recently. Pt has known history of seizure disorder for which she has been on Tegretol and Depakote since age 37. For the past couple of months, pt tegretol and depakote has been increased frequently as pt was noted to have more seizure episodes. Recently her Tegretol was increased from 200 mg twice a day to 400 mg twice a day her Depakote dose remained at 750 mg  twice a day. Pt does have learning disability at baseline but able to take care of herself and help around the house. Pt follows a neurologist at [facility] in [place], but recently moved to [place] to be taken care of by her sister due to the loss of her father who she was living with in [place]. Work up in ED, unremarkable. Depakote level noted elevated. Pt admitted for further management.   Today, pt noted to be more awake, alert,  oriented but slow to respond to questions. Denies any new complaints, able to ambulate in the hallway with PT with no issues. Spoke to sister about the changes in medication and the need to establish care and follow up with outpatient neurology as well as PCP  Hospital Course:  Active Problems:   Valproic acid toxicity   Seizure disorder (HCC)   Acute encephalopathy  Valproic acid toxicity Likely due to recent changes in meds Valproic acid level 111 on admission, held overnight, trended down to 49 Spoke to neurologist on call, recommended decreasing dose to 500 mg twice daily Follow-up with outpatient neurology  Acute metabolic encephalopathy Likely due to above Currently awake, oriented Labs, vital signs unremarkable CT head unremarkable Urine tox negative, alcohol level negative U/A negative EKG normal sinus rhythm PT, with no further recommendations  Seizure disorder No witnessed episode during this admission Spoke to neurologist on-call, recommended decreasing dose of Depakote to 500 mg twice daily, and continuing carbamazepine at 400 mg twice daily Follow-up with outpatient neurology       Malnutrition Type:      Malnutrition Characteristics:      Nutrition Interventions:      Estimated body mass index is 19.68 kg/m as calculated from the following:   Height as of this encounter: 5\' 4"  (1.626 m).   Weight as of this encounter: 52 kg.    Procedures:  None  Consultations:  Spoke to neurologist on call on 10/25/2018  Discharge Exam: BP 113/77 (BP Location: Left Arm)   Pulse 88  Temp 98.7 F (37.1 C) (Oral)   Resp 18   Ht 5\' 4"  (1.626 m)   Wt 52 kg   LMP 10/24/2018   SpO2 100%   BMI 19.68 kg/m   General: NAD Cardiovascular: S1, S2 present Respiratory: CTAB  Discharge Instructions You were cared for by a hospitalist during your hospital stay. If you have any questions about your discharge medications or the care you received while you  were in the hospital after you are discharged, you can call the unit and asked to speak with the hospitalist on call if the hospitalist that took care of you is not available. Once you are discharged, your primary care physician will handle any further medical issues. Please note that NO REFILLS for any discharge medications will be authorized once you are discharged, as it is imperative that you return to your primary care physician (or establish a relationship with a primary care physician if you do not have one) for your aftercare needs so that they can reassess your need for medications and monitor your lab values.   Allergies as of 10/25/2018   No Known Allergies     Medication List    STOP taking these medications   levETIRAcetam 500 MG tablet Commonly known as:  KEPPRA     TAKE these medications   carbamazepine 200 MG tablet Commonly known as:  TEGRETOL Take 400 mg by mouth 2 (two) times daily. What changed:  Another medication with the same name was removed. Continue taking this medication, and follow the directions you see here.   divalproex 500 MG DR tablet Commonly known as:  DEPAKOTE Take 1 tablet (500 mg total) by mouth 2 (two) times daily. What changed:    medication strength  how much to take      No Known Allergies Follow-up Information    [facility]. Schedule an appointment as soon as possible for a visit in 1 week(s).   Why:  To establish care with a primary care provider Contact information: [address] [place] [address] [phone]       [facility]. Schedule an appointment as soon as possible for a visit in 1 week(s).   Why:  Neurologist: make appointment to establish care and follow up on your seizures Contact information: [address]     [address] [place] [address] [phone]           The results of significant diagnostics from this  hospitalization (including imaging, microbiology, ancillary and laboratory) are listed below for reference.    Significant Diagnostic Studies: Ct Head Wo Contrast  Result Date: 10/24/2018 CLINICAL DATA:  Altered mental status EXAM: CT HEAD WITHOUT CONTRAST TECHNIQUE: Contiguous axial images were obtained from the base of the skull through the vertex without intravenous contrast. COMPARISON:  None. FINDINGS: Brain: Cerebellar atrophy. No acute intracranial abnormality. Specifically, no hemorrhage, hydrocephalus, mass lesion, acute infarction, or significant intracranial injury. Vascular: No hyperdense vessel or unexpected calcification. Skull: No acute calvarial abnormality. Sinuses/Orbits: Visualized paranasal sinuses and mastoids clear. Orbital soft tissues unremarkable. Other: None IMPRESSION: Cerebellar atrophy.  No acute intracranial abnormality. Electronically Signed   By: Charlett Nose M.D.   On: 10/24/2018 20:00    Microbiology: No results found for this or any previous visit (from the past 240 hour(s)).   Labs: Basic Metabolic Panel: Recent Labs  Lab 10/24/18 1932 10/25/18 0650  NA 141 140  K 3.7 3.5  CL 107 107  CO2 23 24  GLUCOSE 110* 81  BUN 17 18  CREATININE 1.10* 0.94  CALCIUM 9.3 8.7*  MG  --  2.0  PHOS  --  3.2   Liver Function Tests: Recent Labs  Lab 10/24/18 1932 10/25/18 0650  AST 17 12*  ALT 14 13  ALKPHOS 35* 30*  BILITOT 0.3 0.5  PROT 7.3 6.2*  ALBUMIN 3.3* 2.8*   No results for input(s): LIPASE, AMYLASE in the last 168 hours. No results for input(s): AMMONIA in the last 168 hours. CBC: Recent Labs  Lab 10/24/18 1932 10/25/18 0650  WBC 8.7 8.5  NEUTROABS 6.8  --   HGB 14.0 12.1  HCT 45.3 38.6  MCV 97.2 97.7  PLT 112* 93*   Cardiac Enzymes: No results for input(s): CKTOTAL, CKMB, CKMBINDEX, TROPONINI in the last 168 hours. BNP: BNP (last 3 results) No results for input(s): BNP in the last 8760 hours.  ProBNP (last 3 results) No results  for input(s): PROBNP in the last 8760 hours.  CBG: Recent Labs  Lab 10/24/18 1917  GLUCAP 96       Signed:  Briant Cedar, MD [facility] 10/25/2018, 2:46 PM

## 2018-10-25 NOTE — Progress Notes (Signed)
Pt leaving this afternoon with her sister. Alert, oriented, and without c/o. Discharge instructions/prescription given/explained with pt verbalizing understanding.

## 2019-07-16 ENCOUNTER — Encounter (INDEPENDENT_AMBULATORY_CARE_PROVIDER_SITE_OTHER): Payer: Self-pay | Admitting: Primary Care

## 2019-07-16 ENCOUNTER — Ambulatory Visit (INDEPENDENT_AMBULATORY_CARE_PROVIDER_SITE_OTHER): Payer: Medicare Other | Admitting: Primary Care

## 2019-07-16 ENCOUNTER — Other Ambulatory Visit: Payer: Self-pay

## 2019-07-16 VITALS — BP 123/86 | HR 75 | Temp 97.8°F | Ht 64.0 in | Wt 109.0 lb

## 2019-07-16 DIAGNOSIS — Z760 Encounter for issue of repeat prescription: Secondary | ICD-10-CM

## 2019-07-16 DIAGNOSIS — Z7689 Persons encountering health services in other specified circumstances: Secondary | ICD-10-CM

## 2019-07-16 DIAGNOSIS — G40909 Epilepsy, unspecified, not intractable, without status epilepticus: Secondary | ICD-10-CM | POA: Diagnosis not present

## 2019-07-16 DIAGNOSIS — Z79899 Other long term (current) drug therapy: Secondary | ICD-10-CM | POA: Diagnosis not present

## 2019-07-16 DIAGNOSIS — Z23 Encounter for immunization: Secondary | ICD-10-CM

## 2019-07-16 NOTE — Patient Instructions (Signed)
The USPSTF recommendations screening of cervical cancer every 3 years with cervical cytology.  All women age 39 to 65 years are at risk for cervical cancer because of potential exposure to high risk HPV types to sexual intercourse and should be screened.  Certainly risk factors further increased risk for cervical cancer including HIV infection, a compromised immune system, and utero exposure to diethylstilbestrol and previous treatment of high-grade precancerous lesions or cervical cancer.  Women with these risk factors should receive individual follow-up 

## 2019-07-16 NOTE — Progress Notes (Signed)
 New Patient Office Visit  Subjective:  Patient ID: Monica Snow, female    DOB: 08/28/1981  Age: 38 y.o. MRN: [id number]  CC:  Chief Complaint  Patient presents with  . New Patient (Initial Visit)  . Seizures  . Referral    Neurologist    HPI Monica Snow presents to establish care previously seizures was managed by neurology at [facility] . She is now living with her sister  patient moved to [place]. She is highly functional with some mental deficits. She is able to perform all ADL's needs help with management of IDAL's.    Past Medical History:  Diagnosis Date  . Learning disability   . Seizures (HCC)     Past Surgical History:  Procedure Laterality Date  . TUBAL LIGATION      Family History  Problem Relation Age of Onset  . Brain cancer Mother   . Seizures Father   . Hypertension Father   . Stroke Neg Hx   . Diabetes Neg Hx     Social History   Socioeconomic History  . Marital status: Single    Spouse name: Not on file  . Number of children: Not on file  . Years of education: Not on file  . Highest education level: Not on file  Occupational History  . Not on file  Social Needs  . Financial resource strain: Not on file  . Food insecurity    Worry: Not on file    Inability: Not on file  . Transportation needs    Medical: Not on file    Non-medical: Not on file  Tobacco Use  . Smoking status: Current Every Day Smoker    Packs/day: 0.25    Types: Cigarettes  . Smokeless tobacco: Never Used  . Tobacco comment: willing to quit smoking but not right now  Substance and Sexual Activity  . Alcohol use: Never    Frequency: Never  . Drug use: Never  . Sexual activity: Not on file  Lifestyle  . Physical activity    Days per week: Not on file    Minutes per session: Not on file  . Stress: Not on file  Relationships  . Social connections    Talks on phone: Not on file    Gets together: Not on file    Attends religious service: Not on file     Active member of club or organization: Not on file    Attends meetings of clubs or organizations: Not on file    Relationship status: Not on file  . Intimate partner violence    Fear of current or ex partner: Not on file    Emotionally abused: Not on file    Physically abused: Not on file    Forced sexual activity: Not on file  Other Topics Concern  . Not on file  Social History Narrative  . Not on file   ROS Constitutional: No fever, no chills;  Appetite normal; No weight loss, no weight gain, no fatigue.   HEENT: No blurry vision, no diplopia, no pharyngitis, no dysphagia  CV: No chest pain, no palpitations, no PND, no orthopnea, no edema.   Resp: No SOB, no cough, no pleuritic pain.  GI: No nausea, no vomiting, no diarrhea, no melena, no hematochezia, no constipation, no abdominal pain.   GU: No dysuria, no hematuria, no frequency, no urgency.  MSK: No myalgias, no arthralgias.   Neuro:  No headache, no focal neurological deficits, no history of seizures.     Psych: No depression, no anxiety.   Endo: No heat intolerance, no cold intolerance, no polyuria, no polydipsia   Skin: No rashes, no skin lesions.   Heme: No easy bruising.   Travel history: No recent travel.    Objective:   Today's Vitals: BP 123/86 (BP Location: Left Arm, Patient Position: Sitting, Cuff Size: Normal)   Pulse 75   Temp 97.8 F (36.6 C) (Oral)   Ht 5' 4" (1.626 m)   Wt 109 lb (49.4 kg)   LMP 07/09/2019   SpO2 98%   BMI 18.71 kg/m   Physical Exam Gen: No acute distress. Head: Normocephalic, atraumatic. Eyes: PERRL, EOMI, sclerae nonicteric. Mouth: Oropharynx Neck: Supple, no thyromegaly, no lymphadenopathy, no jugular venous distention. CV: regular rate and rhythm no murmur ascultated   Abdomen: Soft, nontender, nondistended with normal active bowel sounds. Extremities: Extremities Skin: Warm and dry. Neuro: Alert and oriented times 4; cranial nerves II through XII grossly intact. Psych:  Mood and affect normoral Assessment & Plan:  Monica Snow was seen today for new patient (initial visit), seizures and referral.  Diagnoses and all orders for this visit:  Establishing care with new doctor, encounter for Patient is establishing care. She was seen in the emergency room on 10/24/2018 for seizures.  -     CBC with Differential -     CMP14+EGFR  Seizure disorder (HCC) Seizure precautions were discussed avoiding high place climbing or height due to risk of fall, close supervision in swimming pool or bathtub due to risk of drowning. If a seizure, should take be place lay on a  flat surface, turn  on the side to prevent from choking or respiratory issues in case of vomiting, do not place anything in her mouth, never leave the person alone during the seizure, call 911 immediately. Refilled Tegretol until seen by neurologist   -     Ambulatory referral to Neurology -     Carbamazepine level, total -     CBC with Differential -     CMP14+EGFR  High risk medication use -     Carbamazepine level, total -     CBC with Differential -     CMP14+EGFR  Medication refill  Other orders -     Flu Vaccine QUAD 6+ mos PF IM (Fluarix Quad PF) -     Tdap vaccine greater than or equal to 7yo IM    Outpatient Encounter Medications as of 07/16/2019  Medication Sig  . carbamazepine (TEGRETOL) 200 MG tablet Take 400 mg by mouth 2 (two) times daily.  . divalproex (DEPAKOTE) 500 MG DR tablet Take 1 tablet (500 mg total) by mouth 2 (two) times daily. (Patient not taking: Reported on 07/16/2019)   No facility-administered encounter medications on file as of 07/16/2019.     Follow-up: Return for scheule for woman check up.   Kerin Perna, NP

## 2019-07-17 LAB — CMP14+EGFR
ALT: 11 IU/L (ref 0–32)
AST: 9 IU/L (ref 0–40)
Albumin/Globulin Ratio: 1.4 (ref 1.2–2.2)
Albumin: 4.2 g/dL (ref 3.8–4.8)
Alkaline Phosphatase: 60 IU/L (ref 39–117)
BUN/Creatinine Ratio: 11 (ref 9–23)
BUN: 11 mg/dL (ref 6–20)
Bilirubin Total: 0.2 mg/dL (ref 0.0–1.2)
CO2: 21 mmol/L (ref 20–29)
Calcium: 9.5 mg/dL (ref 8.7–10.2)
Chloride: 109 mmol/L — ABNORMAL HIGH (ref 96–106)
Creatinine, Ser: 1.03 mg/dL — ABNORMAL HIGH (ref 0.57–1.00)
GFR calc Af Amer: 79 mL/min/{1.73_m2} (ref >59)
GFR calc non Af Amer: 69 mL/min/{1.73_m2} (ref >59)
Globulin, Total: 3.1 g/dL (ref 1.5–4.5)
Glucose: 91 mg/dL (ref 65–99)
Potassium: 4.1 mmol/L (ref 3.5–5.2)
Sodium: 144 mmol/L (ref 134–144)
Total Protein: 7.3 g/dL (ref 6.0–8.5)

## 2019-07-17 LAB — CBC WITH DIFFERENTIAL/PLATELET
Basophils Absolute: 0.1 10*3/uL (ref 0.0–0.2)
Basos: 1 %
EOS (ABSOLUTE): 0.1 10*3/uL (ref 0.0–0.4)
Eos: 3 %
Hematocrit: 36.2 % (ref 34.0–46.6)
Hemoglobin: 12.2 g/dL (ref 11.1–15.9)
Immature Grans (Abs): 0 10*3/uL (ref 0.0–0.1)
Immature Granulocytes: 0 %
Lymphocytes Absolute: 1.7 10*3/uL (ref 0.7–3.1)
Lymphs: 33 %
MCH: 30 pg (ref 26.6–33.0)
MCHC: 33.7 g/dL (ref 31.5–35.7)
MCV: 89 fL (ref 79–97)
Monocytes Absolute: 0.4 10*3/uL (ref 0.1–0.9)
Monocytes: 7 %
Neutrophils Absolute: 2.9 10*3/uL (ref 1.4–7.0)
Neutrophils: 56 %
Platelets: 198 10*3/uL (ref 150–450)
RBC: 4.06 x10E6/uL (ref 3.77–5.28)
RDW: 12.7 % (ref 11.7–15.4)
WBC: 5.1 10*3/uL (ref 3.4–10.8)

## 2019-07-17 LAB — CARBAMAZEPINE LEVEL, TOTAL: Carbamazepine (Tegretol), S: 12.1 ug/mL — ABNORMAL HIGH (ref 4.0–12.0)

## 2019-07-21 ENCOUNTER — Encounter: Payer: Self-pay | Admitting: Neurology

## 2019-07-21 ENCOUNTER — Telehealth (INDEPENDENT_AMBULATORY_CARE_PROVIDER_SITE_OTHER): Payer: Self-pay

## 2019-07-21 NOTE — Telephone Encounter (Signed)
-----   Message from Kerin Perna, NP sent at 07/18/2019  9:25 AM EDT ----- I have reviewed all labs and they are normal/unremarkable.

## 2019-07-21 NOTE — Telephone Encounter (Signed)
Patient returned call. Verified date of birth. She is aware that labs are normal. Nat Christen, CMA

## 2019-08-06 ENCOUNTER — Ambulatory Visit (INDEPENDENT_AMBULATORY_CARE_PROVIDER_SITE_OTHER): Payer: Medicare Other | Admitting: Primary Care

## 2019-08-19 ENCOUNTER — Other Ambulatory Visit: Payer: Self-pay

## 2019-08-19 ENCOUNTER — Encounter (INDEPENDENT_AMBULATORY_CARE_PROVIDER_SITE_OTHER): Payer: Self-pay | Admitting: Primary Care

## 2019-08-19 ENCOUNTER — Ambulatory Visit (INDEPENDENT_AMBULATORY_CARE_PROVIDER_SITE_OTHER): Payer: Medicare Other | Admitting: Primary Care

## 2019-08-19 ENCOUNTER — Other Ambulatory Visit (HOSPITAL_COMMUNITY)
Admission: RE | Admit: 2019-08-19 | Discharge: 2019-08-19 | Disposition: A | Discharge status: Home / Self Care | Payer: Medicare Other | Source: Ambulatory Visit | Attending: Primary Care | Admitting: Primary Care

## 2019-08-19 VITALS — BP 126/84 | HR 83 | Temp 98.4°F | Ht 64.0 in | Wt 107.8 lb

## 2019-08-19 DIAGNOSIS — Z8669 Personal history of other diseases of the nervous system and sense organs: Secondary | ICD-10-CM | POA: Diagnosis not present

## 2019-08-19 DIAGNOSIS — F17210 Nicotine dependence, cigarettes, uncomplicated: Secondary | ICD-10-CM | POA: Diagnosis not present

## 2019-08-19 DIAGNOSIS — Z124 Encounter for screening for malignant neoplasm of cervix: Secondary | ICD-10-CM | POA: Diagnosis present

## 2019-08-19 DIAGNOSIS — Z1151 Encounter for screening for human papillomavirus (HPV): Secondary | ICD-10-CM | POA: Diagnosis not present

## 2019-08-19 NOTE — Patient Instructions (Addendum)
The USPSTF recommendations screening of cervical cancer every 3 years with cervical cytology.  All women age 39 to 55 years are at risk for cervical cancer because of potential exposure to high risk HPV types to sexual intercourse and should be screened.  Certainly risk factors further increased risk for cervical cancer including HIV infection, a compromised immune system, and utero exposure to diethylstilbestrol and previous treatment of high-grade precancerous lesions or cervical cancer.  Women with these risk factors should receive individual follow-up .me

## 2019-08-19 NOTE — Progress Notes (Signed)
Established Patient Office Visit  Subjective:  Patient ID: Monica Snow, female    DOB: 03/12/1981  Age: 38 y.o. MRN: [id number]  CC:  Chief Complaint  Patient presents with  . Gynecologic Exam    HPI Monica Snow presents for well woman visit cervical cancer screening. She voices no concerns or problems.  Past Medical History:  Diagnosis Date  . Learning disability   . Seizures ([place])     Past Surgical History:  Procedure Laterality Date  . TUBAL LIGATION      Family History  Problem Relation Age of Onset  . Brain cancer Mother   . Seizures Father   . Hypertension Father   . Stroke Neg Hx   . Diabetes Neg Hx     Social History   Socioeconomic History  . Marital status: Single    Spouse name: Not on file  . Number of children: Not on file  . Years of education: Not on file  . Highest education level: Not on file  Occupational History  . Not on file  Social Needs  . Financial resource strain: Not on file  . Food insecurity    Worry: Not on file    Inability: Not on file  . Transportation needs    Medical: Not on file    Non-medical: Not on file  Tobacco Use  . Smoking status: Current Every Day Smoker    Packs/day: 0.25    Types: Cigarettes  . Smokeless tobacco: Never Used  . Tobacco comment: willing to quit smoking but not right now  Substance and Sexual Activity  . Alcohol use: Never    Frequency: Never  . Drug use: Never  . Sexual activity: Not on file  Lifestyle  . Physical activity    Days per week: Not on file    Minutes per session: Not on file  . Stress: Not on file  Relationships  . Social Herbalist on phone: Not on file    Gets together: Not on file    Attends religious service: Not on file    Active member of club or organization: Not on file    Attends meetings of clubs or organizations: Not on file    Relationship status: Not on file  . Intimate partner violence    Fear of current or ex partner: Not on file    Emotionally abused: Not on file    Physically abused: Not on file    Forced sexual activity: Not on file  Other Topics Concern  . Not on file  Social History Narrative  . Not on file    Outpatient Medications Prior to Visit  Medication Sig Dispense Refill  . carbamazepine (TEGRETOL) 200 MG tablet Take 400 mg by mouth 2 (two) times daily.    . divalproex (DEPAKOTE) 500 MG DR tablet Take 1 tablet (500 mg total) by mouth 2 (two) times daily. (Patient not taking: Reported on 07/16/2019) 60 tablet 0   No facility-administered medications prior to visit.     No Known Allergies  ROS Review of Systems  All other systems reviewed and are negative.     Objective:    Physical Exam CONSTITUTIONAL: Well-developed, well-nourished female  Thin frame in no acute distress.  HENT:  Normocephalic, atraumatic, External right and left ear normal. Oropharynx is clear and moist EYES: Conjunctivae and EOM are normal. Pupils are equal, round, and reactive to light. No scleral icterus.  NECK: Normal range of motion, supple, no  masses.  Normal thyroid.  SKIN: Skin is warm and dry. No rash noted. Not diaphoretic. No erythema. No pallor. NEUROLGIC: Alert and oriented to person, place, and time. Normal reflexes, muscle tone coordination. No cranial nerve deficit noted. PSYCHIATRIC: Normal mood and affect. Normal behavior. Normal judgment and thought content. CARDIOVASCULAR: Normal heart rate noted, regular rhythm RESPIRATORY: Clear to auscultation bilaterally. Effort and breath sounds normal, no problems with respiration noted. BREASTS: Taught how to do SBE ABDOMEN: Soft, normal bowel sounds, no distention noted.  No tenderness, rebound or guarding.  PELVIC: Normal appearing external genitalia; normal appearing vaginal mucosa and cervix.  No abnormal discharge noted.  Pap smear obtained.  Normal uterine size, no other palpable masses, no uterine or adnexal tenderness. MUSCULOSKELETAL: Normal range of  motion. No tenderness.  No cyanosis, clubbing, or edema.  2+ distal pulses.  BP 126/84 (BP Location: Right Arm, Patient Position: Sitting, Cuff Size: Small)   Pulse 83   Temp 98.4 F (36.9 C) (Temporal)   Ht 5\' 4"  (1.626 m)   Wt 107 lb 12.8 oz (48.9 kg)   LMP 08/11/2019 (Approximate)   SpO2 98%   BMI 18.50 kg/m  Wt Readings from Last 3 Encounters:  08/19/19 107 lb 12.8 oz (48.9 kg)  07/16/19 109 lb (49.4 kg)  10/25/18 114 lb 10.2 oz (52 kg)     Health Maintenance Due  Topic Date Due  . PAP SMEAR-Modifier  01/02/2001    There are no preventive care reminders to display for this patient.  Lab Results  Component Value Date   TSH 1.526 10/25/2018   Lab Results  Component Value Date   WBC 5.1 07/16/2019   HGB 12.2 07/16/2019   HCT 36.2 07/16/2019   MCV 89 07/16/2019   PLT 198 07/16/2019   Lab Results  Component Value Date   NA 144 07/16/2019   K 4.1 07/16/2019   CO2 21 07/16/2019   GLUCOSE 91 07/16/2019   BUN 11 07/16/2019   CREATININE 1.03 (H) 07/16/2019   BILITOT 0.2 07/16/2019   ALKPHOS 60 07/16/2019   AST 9 07/16/2019   ALT 11 07/16/2019   PROT 7.3 07/16/2019   ALBUMIN 4.2 07/16/2019   CALCIUM 9.5 07/16/2019   ANIONGAP 9 10/25/2018   No results found for: CHOL No results found for: HDL No results found for: LDLCALC No results found for: TRIG No results found for: CHOLHDL No results found for: 10/27/2018    Assessment & Plan:  Monica Snow was seen today for gynecologic exam.  Diagnoses and all orders for this visit:  Encounter for Papanicolaou smear for cervical cancer screening Completed    No orders of the defined types were placed in this encounter.   Follow-up: Return if symptoms worsen or fail to improve.    Judeth Cornfield, NP

## 2019-08-31 ENCOUNTER — Other Ambulatory Visit (INDEPENDENT_AMBULATORY_CARE_PROVIDER_SITE_OTHER): Payer: Self-pay | Admitting: Primary Care

## 2019-08-31 LAB — CYTOLOGY - PAP
Comment: NEGATIVE
Diagnosis: NEGATIVE
High risk HPV: NEGATIVE

## 2019-08-31 MED ORDER — METRONIDAZOLE 500 MG PO TABS: 2000.0000 mg | ORAL_TABLET | Freq: Once | ORAL | 0 refills | Status: AC

## 2019-09-02 ENCOUNTER — Telehealth (INDEPENDENT_AMBULATORY_CARE_PROVIDER_SITE_OTHER): Payer: Self-pay

## 2019-09-02 NOTE — Telephone Encounter (Signed)
-----   Message from Kerin Perna, NP sent at 08/31/2019 11:49 PM EDT ----- Monica Snow is present sent in flagyl 2000mg  take all 4 pillls at once

## 2019-09-02 NOTE — Telephone Encounter (Signed)
Patient returned call to [facility]. She verified her date of birth. She is aware that pap was normal except for positive trichomonas. Patient already picked up Rx and took as prescribed. Nat Christen, CMA

## 2019-09-10 ENCOUNTER — Encounter: Payer: Self-pay | Admitting: Neurology

## 2019-09-10 ENCOUNTER — Other Ambulatory Visit: Payer: Self-pay

## 2019-09-10 ENCOUNTER — Ambulatory Visit (INDEPENDENT_AMBULATORY_CARE_PROVIDER_SITE_OTHER): Payer: Medicare Other | Admitting: Neurology

## 2019-09-10 VITALS — BP 123/83 | HR 71 | Ht 64.0 in | Wt 105.4 lb

## 2019-09-10 DIAGNOSIS — G40219 Localization-related (focal) (partial) symptomatic epilepsy and epileptic syndromes with complex partial seizures, intractable, without status epilepticus: Secondary | ICD-10-CM | POA: Diagnosis not present

## 2019-09-10 MED ORDER — LEVETIRACETAM 500 MG PO TABS: ORAL_TABLET | ORAL | 11 refills | Status: DC

## 2019-09-10 MED ORDER — CARBAMAZEPINE 200 MG PO TABS: ORAL_TABLET | ORAL | 3 refills | Status: DC

## 2019-09-10 NOTE — Patient Instructions (Signed)
1. Schedule MRI brain with and without contrast  2. Schedule 1-hour EEG  3. Start Keppra (Levetiracetam) 500mg : take 1 tablet twice a day for 2 weeks, then increase to 2 tablets twice a day  4. Continue carbamazepine 200mg : take 2 tabs twice a day  5. Follow-up in 3 months, call for any changes  Seizure Precautions: 1. If medication has been prescribed for you to prevent seizures, take it exactly as directed.  Do not stop taking the medicine without talking to your doctor first, even if you have not had a seizure in a long time.   2. Avoid activities in which a seizure would cause danger to yourself or to others.  Don't operate dangerous machinery, swim alone, or climb in high or dangerous places, such as on ladders, roofs, or girders.  Do not drive unless your doctor says you may.  3. If you have any warning that you may have a seizure, lay down in a safe place where you can't hurt yourself.    4.  No driving for 6 months from last seizure, as per [facility].   Please refer to the following link on the [place] website for more information: http://www.epilepsyfoundation.org/answerplace/Social/driving/drivingu.cfm   5.  Maintain good sleep hygiene. Avoid alcohol.  6.  Notify your neurology if you are planning pregnancy or if you become pregnant.  7.  Contact your doctor if you have any problems that may be related to the medicine you are taking.  8.  Call 911 and bring the patient back to the ED if:        A.  The seizure lasts longer than 5 minutes.       B.  The patient doesn't awaken shortly after the seizure  C.  The patient has new problems such as difficulty seeing, speaking or moving  D.  The patient was injured during the seizure  E.  The patient has a temperature over 102 F (39C)  F.  The patient vomited and now is having trouble breathing

## 2019-09-10 NOTE — Progress Notes (Signed)
NEUROLOGY CONSULTATION NOTE  Monica Snow MRN: [id number] DOB: 09-16-1981  Referring provider: Juluis Mire, NP Primary care provider: Juluis Mire, NP   Reason for consult:  seizures  Thank you for your kind referral of Monica Snow for consultation of the above symptoms. Although her history is well known to you, please allow me to reiterate it for the purpose of our medical record. The patient was accompanied to the clinic by her twin sister Monica Snow who also provides collateral information. Records and images were personally reviewed where available.  HISTORY OF PRESENT ILLNESS: This is a 38 year old left-handed woman with a history of intellectual disability, presenting for evaluation of seizures. She is a poor historian, her sister provides majority of history. Records from her prior neurologist are unavailable for review. She is a product of a twin pregnancy, Monica Snow her twin is present today. Monica Snow reports her first seizure occurred at 29 months of age, she was seizure-free for many years, then had her first convulsion at age 38 with her right arm flexed upward, left arm over her stomach, she spun around then went into a convulsion. Seizure semiology is still the same until now. She also has seizures where she would stare and start screaming "what, what!" from the top of her lungs for 1-4 minutes, the she is confused and starts fiddling with things. One time she was running up and down the street naked and was admitted to inpatient Psychiatry. She has nocturnal seizures where she would start yelling or convulsing with right head deviation. Monica Snow reports sometimes her eyes or mouth jerks even when she is not having a seizure. Monica Snow also reports status epilepticus 7 years ago while living in [place] where she was put in an induced coma. She has an average of 10 seizures a week, she sustained a black eye on the right from a seizure last Tuesday. Last seizure was yesterday.  She was admitted to [facility] in December 2019 for lethargy due to Depakote toxicity, Depakote level was 111. Per notes, she was having more seizures and her neurologist was increasing Tegretol and Depakote. Her Tegretol had been increased to 400mg  BID and Depakote 750mg  BID. It was recommended the Depakote dose be reduced to 500mg  BID, however her sister was concerned that she was incontinent while on Depakote and did not continue medication. She has only been taking Tegretol 400mg  BID with no side effects (level 07/2019 was 12.1). Her sister reports that she was still having seizures despite being toxic on Depakote. She takes her own medications. She denies any olfactory/gustatory hallucinations, rising epigastric sensation, focal numbness/tingling/weakness. She has some dizziness prior to the seizure but no other warning symptoms. No headaches, diplopia, dysarthria/dysphagia, neck/back pain, bladder dysfunction. She has occasional constipation. She has been living with her sister for 3 years, her sister reports "she has a problem with authority." She stopped smoking marijuana in November. She has 2 children living in [place]. She is unemployed and does not drive.   Epilepsy Risk Factors:  Their father also had a seizure at 39 months of age then at 19. She was a product of a twin pregnancy and was in special education for a learning disability. There is no history of febrile convulsions, CNS infections such as meningitis/encephalitis, significant traumatic brain injury, neurosurgical procedures.  Prior AEDs: Dilantin   PAST MEDICAL HISTORY: Past Medical History:  Diagnosis Date   Learning disability    Seizures ([place])     PAST SURGICAL HISTORY: Past Surgical  History:  Procedure Laterality Date   TUBAL LIGATION      MEDICATIONS: Current Outpatient Medications on File Prior to Visit  Medication Sig Dispense Refill   carbamazepine (TEGRETOL) 200 MG tablet Take 400 mg by mouth 2 (two)  times daily.     No current facility-administered medications on file prior to visit.     ALLERGIES: No Known Allergies  FAMILY HISTORY: Family History  Problem Relation Age of Onset   Brain cancer Mother    Seizures Father    Hypertension Father    Stroke Neg Hx    Diabetes Neg Hx     SOCIAL HISTORY: Social History   Socioeconomic History   Marital status: Single    Spouse name: Not on file   Number of children: Not on file   Years of education: 8   Highest education level: Not on file  Occupational History   Not on file  Social Needs   Financial resource strain: Not on file   Food insecurity    Worry: Not on file    Inability: Not on file   Transportation needs    Medical: Not on file    Non-medical: Not on file  Tobacco Use   Smoking status: Current Every Day Smoker    Packs/day: 0.25    Types: Cigarettes   Smokeless tobacco: Never Used   Tobacco comment: willing to quit smoking but not right now  Substance and Sexual Activity   Alcohol use: Never    Frequency: Never   Drug use: Never   Sexual activity: Not on file  Lifestyle   Physical activity    Days per week: Not on file    Minutes per session: Not on file   Stress: Not on file  Relationships   Social connections    Talks on phone: Not on file    Gets together: Not on file    Attends religious service: Not on file    Active member of club or organization: Not on file    Attends meetings of clubs or organizations: Not on file    Relationship status: Not on file   Intimate partner violence    Fear of current or ex partner: Not on file    Emotionally abused: Not on file    Physically abused: Not on file    Forced sexual activity: Not on file  Other Topics Concern   Not on file  Social History Narrative   Right handed      Completed 8th grade      Lives with sister    REVIEW OF SYSTEMS: Constitutional: No fevers, chills, or sweats, no generalized fatigue, change  in appetite Eyes: No visual changes, double vision, eye pain Ear, nose and throat: No hearing loss, ear pain, nasal congestion, sore throat Cardiovascular: No chest pain, palpitations Respiratory:  No shortness of breath at rest or with exertion, wheezes GastrointestinaI: No nausea, vomiting, diarrhea, abdominal pain, fecal incontinence Genitourinary:  No dysuria, urinary retention or frequency Musculoskeletal:  No neck pain, back pain Integumentary: No rash, pruritus, skin lesions Neurological: as above Psychiatric: No depression, insomnia, anxiety Endocrine: No palpitations, fatigue, diaphoresis, mood swings, change in appetite, change in weight, increased thirst Hematologic/Lymphatic:  No anemia, purpura, petechiae. Allergic/Immunologic: no itchy/runny eyes, nasal congestion, recent allergic reactions, rashes  PHYSICAL EXAM: Vitals:   09/10/19 0902  BP: 123/83  Pulse: 71  SpO2: 99%   General: No acute distress Head:  Normocephalic, +right conjunctival hemorrhage Skin/Extremities: No rash, no  edema Neurological Exam: Mental status: alert and oriented to person, place, and time, no dysarthria or aphasia, Fund of knowledge is reduced.  Remote memory impaired. 3/3 delayed recall. Attention and concentration are normal.    Able to name objects and repeat phrases. Cranial nerves: CN I: not tested CN II: pupils equal, round and reactive to light, visual fields intact CN III, IV, VI:  full range of motion, no nystagmus, no ptosis CN V: facial sensation intact CN VII: upper and lower face symmetric CN VIII: hearing intact to conversation CN IX, X: gag intact, uvula midline CN XI: sternocleidomastoid and trapezius muscles intact CN XII: tongue midline Bulk & Tone: normal, no fasciculations. Motor: 5/5 throughout with no pronator drift. Sensation: intact to light touch, cold, pin, vibration and joint position sense.  No extinction to double simultaneous stimulation.  Romberg test  negative Deep Tendon Reflexes: +2 throughout, no ankle clonus Plantar responses: downgoing bilaterally Cerebellar: no incoordination on finger to nose rwarinf Gait: narrow-based and steady, able to tandem walk adequately. 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 She continues to have 10 seizures a week, we discussed starting Levetiracetam 500mg  BID for 2 weeks, then increase to 1000mg  BID. Side effects discussed. Continue carbamazepine 400mg  BID. MRI brain with and without contrast and a 1-hour EEG will be ordered to further classify seizures. She does not drive. Follow-up in 3 months, they know to call for any changes.   Thank you for allowing me to participate in the care of this patient. Please do not hesitate to call for any questions or concerns.   , M.D.  CC: , NP

## 2019-09-13 ENCOUNTER — Other Ambulatory Visit: Payer: Self-pay

## 2019-09-13 ENCOUNTER — Ambulatory Visit (INDEPENDENT_AMBULATORY_CARE_PROVIDER_SITE_OTHER): Payer: Medicare Other | Admitting: Neurology

## 2019-09-13 DIAGNOSIS — G40219 Localization-related (focal) (partial) symptomatic epilepsy and epileptic syndromes with complex partial seizures, intractable, without status epilepticus: Secondary | ICD-10-CM

## 2019-09-20 ENCOUNTER — Encounter: Payer: Self-pay | Admitting: Neurology

## 2019-09-23 NOTE — Procedures (Signed)
ELECTROENCEPHALOGRAM REPORT  Date of Study: 09/13/2019  Patient's Name: Monica Snow MRN: [id number] Date of Birth: February 10, 1981  Referring Provider: Dr. Ellouise Newer  Clinical History: This is a 38 year old woman with frequent focal seizures that generalize with right head deviation. EEG for classification.  Medications: Tegretol Keppra  Technical Summary: A multichannel digital 1-hour EEG recording measured by the international 10-20 system with electrodes applied with paste and impedances below 5000 ohms performed in our laboratory with EKG monitoring in an awake and asleep patient.  Hyperventilation was not performed. Photic stimulation was performed.  The digital EEG was referentially recorded, reformatted, and digitally filtered in a variety of bipolar and referential montages for optimal display.    Description: The patient is awake and asleep during the recording.  During maximal wakefulness, there is a symmetric, medium voltage 8-9 Hz posterior dominant rhythm that attenuates with eye opening. There is occasional focal theta and delta slowing over the left hemisphere. During drowsiness and sleep, there is an increase in theta slowing of the background.  Vertex waves and symmetric sleep spindles were seen.  Photic stimulation did not elicit any abnormalities.  There were frequent spike and polyspike and wave discharges seen over the left frontocentral region, at times with spread to the right frontocentral and left temporal regions. There is an increase in frequency of epileptiform discharges in sleep, with bursts occurring every 2-5 seconds. No electrographic seizures seen.   EKG lead was unremarkable.  Impression: This 1-hour awake and asleep EEG is abnormal due to the presence of: 1. Occasional focal slowing over the left hemisphere 2. Frequent spike and polyspike and wave discharges over the left frontocentral region, at times with spread to the right frontocentral and left  temporal regions  Clinical Correlation of the above findings indicates focal cerebral dysfunction over the left hemisphere suggestive of underlying structural or physiologic abnormality. There is a tendency for seizures to arise from the left frontocentral region. Clinical correlation is advised.    Ellouise Newer, M.D.

## 2019-09-23 NOTE — Procedures (Deleted)
ELECTROENCEPHALOGRAM REPORT  Date of Study: 09/13/2019  Patient's Name: Monica Snow MRN: [id number] Date of Birth: 07/30/1981  Referring Provider: Dr. Karen Aquino  Clinical History: This is a 38 year old woman with frequent focal seizures that generalize with right head deviation. EEG for classification.  Medications: Tegretol Keppra  Technical Summary: A multichannel digital 1-hour EEG recording measured by the international 10-20 system with electrodes applied with paste and impedances below 5000 ohms performed in our laboratory with EKG monitoring in an awake and asleep patient.  Hyperventilation was not performed. Photic stimulation was performed.  The digital EEG was referentially recorded, reformatted, and digitally filtered in a variety of bipolar and referential montages for optimal display.    Description: The patient is awake and asleep during the recording.  During maximal wakefulness, there is a symmetric, medium voltage 8-9 Hz posterior dominant rhythm that attenuates with eye opening. There is occasional focal theta and delta slowing over the left hemisphere. During drowsiness and sleep, there is an increase in theta slowing of the background.  Vertex waves and symmetric sleep spindles were seen.  Photic stimulation did not elicit any abnormalities.  There were frequent spike and polyspike and wave discharges seen over the left frontocentral region, at times with spread to the right frontocentral and left temporal regions. There is an increase in frequency of epileptiform discharges in sleep, with bursts occurring every 2-5 seconds. No electrographic seizures seen.   EKG lead was unremarkable.  Impression: This 1-hour awake and asleep EEG is abnormal due to the presence of: 1. Occasional focal slowing over the left hemisphere 2. Frequent spike and polyspike and wave discharges over the left frontocentral region, at times with spread to the right frontocentral and left  temporal regions  Clinical Correlation of the above findings indicates focal cerebral dysfunction over the left hemisphere suggestive of underlying structural or physiologic abnormality. There is a tendency for seizures to arise from the left frontocentral region. Clinical correlation is advised.    Karen Aquino, M.D.   

## 2019-10-05 ENCOUNTER — Other Ambulatory Visit: Payer: Medicare Other

## 2019-10-29 ENCOUNTER — Inpatient Hospital Stay: Admission: RE | Admit: 2019-10-29 | Payer: Medicare Other | Source: Ambulatory Visit

## 2019-11-22 ENCOUNTER — Encounter: Payer: Self-pay | Admitting: Neurology

## 2019-12-01 ENCOUNTER — Ambulatory Visit: Payer: Medicare Other | Admitting: Neurology

## 2019-12-01 ENCOUNTER — Telehealth (INDEPENDENT_AMBULATORY_CARE_PROVIDER_SITE_OTHER): Payer: Medicare Other | Admitting: Neurology

## 2019-12-01 ENCOUNTER — Encounter: Payer: Self-pay | Admitting: Neurology

## 2019-12-01 ENCOUNTER — Other Ambulatory Visit: Payer: Self-pay

## 2019-12-01 VITALS — Ht 64.0 in | Wt 105.0 lb

## 2019-12-01 DIAGNOSIS — G40219 Localization-related (focal) (partial) symptomatic epilepsy and epileptic syndromes with complex partial seizures, intractable, without status epilepticus: Secondary | ICD-10-CM | POA: Diagnosis not present

## 2019-12-01 MED ORDER — LEVETIRACETAM 500 MG PO TABS: ORAL_TABLET | ORAL | 3 refills | Status: DC

## 2019-12-01 MED ORDER — CARBAMAZEPINE 200 MG PO TABS: ORAL_TABLET | ORAL | 3 refills | Status: DC

## 2019-12-01 NOTE — Progress Notes (Signed)
Telephone (Audio) Visit The purpose of this telephone visit is to provide medical care while limiting exposure to the novel coronavirus.    Consent was obtained for telephone visit:  Yes.   Answered questions that patient had about telehealth interaction:  Yes.   I discussed the limitations, risks, security and privacy concerns of performing an evaluation and management service by telephone. I also discussed with the patient that there may be a patient responsible charge related to this service. The patient expressed understanding and agreed to proceed.  Pt location: Home Physician Location: office Name of referring provider:  Grayce Sessions, NP I connected with .Melton Alar at patients initiation/request on 12/01/2019 at  2:00 PM EST by telephone and verified that I am speaking with the correct person using two identifiers.  Pt MRN:  [id number] Pt DOB:  1981-07-30   History of Present Illness:  The patient had a telephone visit on 12/01/2019. She was last seen 3 months ago in the neurology clinic for recurrent seizures. Her twin sister Elmarie Shiley is present during the visit to provide additional information. Since her last visit, she had a 1-hour EEG which was abnormal showing occasional focal slowing over the left hemisphere. There were frequent spike and polyspike and wave discharges over the left frontocentral region, at times with spread to the right frontocentral and left temporal regions. She has not had the MRI brain done yet. On her last visit, her sister reported an average of 10 seizures a week on carbamazepine 400mg  BID. We added on Levetiracetam 1000mg  BID. She has had a significant reduction in seizures. She reports a lot in November, 1 in December, and 1 this month last 11/18/2019. Her sister confirms that she had a little mild seizures and maybe 2 "whole" seizures. She reports dizziness and drowsiness on the LEV, she takes the medication and feels dizzy, laying down and the  dizziness goes away. Her sister reports she sleeps all the time. She takes medication at 9pm, goes to bed at 12 or 1am, then wakes up to take her medication then back to sleep until 2-4pm. She denies any headaches, focal numbness/tingling/weakness, no falls. When asked about mood, she states she gets a little angry at her nephew or sister. No paranoia or hallucinations. Her last fall was with a seizure in November, no injuries.   History on Initial Assessment 09/10/2019: This is a 39 year old left-handed woman with a history of intellectual disability, presenting for evaluation of seizures. She is a poor historian, her sister provides majority of history. Records from her prior neurologist are unavailable for review. She is a product of a twin pregnancy, [name] her twin is present today. [name] reports her first seizure occurred at 31 months of age, she was seizure-free for many years, then had her first convulsion at age 33 with her right arm flexed upward, left arm over her stomach, she spun around then went into a convulsion. Seizure semiology is still the same until now. She also has seizures where she would stare and start screaming "what, what!" from the top of her lungs for 1-4 minutes, the she is confused and starts fiddling with things. One time she was running up and down the street naked and was admitted to inpatient Psychiatry. She has nocturnal seizures where she would start yelling or convulsing with right head deviation. [name] reports sometimes her eyes or mouth jerks even when she is not having a seizure. [name] also reports status epilepticus 7 years ago while  living in [place] where she was put in an induced coma. She has an average of 10 seizures a week, she sustained a black eye on the right from a seizure last Tuesday. Last seizure was yesterday. She was admitted to [facility] in December 2019 for lethargy due to Depakote toxicity, Depakote level was 111. Per notes, she was having more  seizures and her neurologist was increasing Tegretol and Depakote. Her Tegretol had been increased to 400mg  BID and Depakote 750mg  BID. It was recommended the Depakote dose be reduced to 500mg  BID, however her sister was concerned that she was incontinent while on Depakote and did not continue medication. She has only been taking Tegretol 400mg  BID with no side effects (level 07/2019 was 12.1). Her sister reports that she was still having seizures despite being toxic on Depakote. She takes her own medications. She denies any olfactory/gustatory hallucinations, rising epigastric sensation, focal numbness/tingling/weakness. She has some dizziness prior to the seizure but no other warning symptoms. No headaches, diplopia, dysarthria/dysphagia, neck/back pain, bladder dysfunction. She has occasional constipation. She has been living with her sister for 3 years, her sister reports "she has a problem with authority." She stopped smoking marijuana in November. She has 2 children living in [place]. She is unemployed and does not drive.   Epilepsy Risk Factors:  Their father also had a seizure at 27 months of age then at 26. She was a product of a twin pregnancy and was in special education for a learning disability. There is no history of febrile convulsions, CNS infections such as meningitis/encephalitis, significant traumatic brain injury, neurosurgical procedures.  Prior AEDs: Dilantin   Observations/Objective:   Vitals:   12/01/19 1330  Weight: 105 lb (47.6 kg)  Height: 5\' 4"  (1.626 m)   Limited due to nature of phone visit. Patient is awake, alert, able to answer questions without dysarthria noted.   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 EEG showed left hemisphere slowing and frequent spike and polyspike discharges over the left frontocentral region. Proceed with MRI brain  as previously discussed. She has had a significant reduction in seizures with addition of Levetiracetam 1000mg  BID to carbamazepine 400mg  BID, however there is some daytime drowsiness. We discussed taking LEV earlier in the evening and improving sleep hygiene. We have agreed to continue on current medications for now, continue seizure calendar, follow-up in 3 months. She does not drive.    Follow Up Instructions:   -I discussed the assessment and treatment plan with the patient/sister The patient/sister were provided an opportunity to ask questions and all were answered. The patient/sister agreed with the plan and demonstrated an understanding of the instructions.   The patient/sister were advised to call back or seek an in-person evaluation if the symptoms worsen or if the condition fails to improve as anticipated.    Total Time spent in visit with the patient was:  13:04 minutes, of which 100% of the time was spent in counseling and/or coordinating care on the above.   Pt understands and agrees with the plan of care outlined.     Cameron Sprang, MD

## 2020-03-10 ENCOUNTER — Ambulatory Visit (INDEPENDENT_AMBULATORY_CARE_PROVIDER_SITE_OTHER): Payer: Medicare Other | Admitting: Neurology

## 2020-03-10 ENCOUNTER — Encounter: Payer: Self-pay | Admitting: Neurology

## 2020-03-10 ENCOUNTER — Other Ambulatory Visit: Payer: Self-pay

## 2020-03-10 VITALS — BP 113/77 | HR 65 | Ht 64.0 in | Wt 104.0 lb

## 2020-03-10 DIAGNOSIS — G40219 Localization-related (focal) (partial) symptomatic epilepsy and epileptic syndromes with complex partial seizures, intractable, without status epilepticus: Secondary | ICD-10-CM | POA: Diagnosis not present

## 2020-03-10 MED ORDER — CARBAMAZEPINE 200 MG PO TABS: ORAL_TABLET | ORAL | 3 refills | Status: DC

## 2020-03-10 MED ORDER — LEVETIRACETAM 500 MG PO TABS: ORAL_TABLET | ORAL | 3 refills | Status: DC

## 2020-03-10 NOTE — Progress Notes (Signed)
NEUROLOGY FOLLOW UP OFFICE NOTE  Monica Snow [id number] 09/23/1981  HISTORY OF PRESENT ILLNESS: I had the pleasure of seeing Monica Snow in follow-up in the neurology clinic on 03/10/2020.  The patient was last seen 3 months ago for recurrent seizures. She is alone in the office today. Her 1-hour EEG which was abnormal showing occasional focal slowing over the left hemisphere. There were frequent spike and polyspike and wave discharges over the left frontocentral region, at times with spread to the right frontocentral and left temporal regions. She has not done MRI brain yet. On her last visit, her sister was reporting drowsiness with Levetiracetam. She has started taking it earlier at bedtime, and states she does not sleep like she used to sleep all the time. She may take naps sometimes, but not always. She is on Levetiracetam 1000mg  BID (500mg  2 tabs BID) and carbamazepine 400mg  BID (200mg  2 tabs BID). She says the seizures are not as bad as before, however she is a poor historian. She reports a seizure mid-April, but cannot recall when other seizures were. She cannot recall her last fall. On last visit, she and her sister were reporting 1 seizure a month (previously had 10 seizures a week prior to addition of LEV). She sleeps well. Mood is fine, sometimes her nephew makes her mad. She denies any headaches, focal numbness/tingling/weakness, olfactory/gustatory hallucinations, myoclonic jerks. She gets dizzy sometimes when she has not eaten and would smoke a cigarette.    History on Initial Assessment 09/10/2019: This is a 39 year old left-handed woman with a history of intellectual disability, presenting for evaluation of seizures. She is a poor historian, her sister provides majority of history. Records from her prior neurologist are unavailable for review. She is a product of a twin pregnancy, Monica Snow her twin is present today. Monica Snow reports her first seizure occurred at 14 months of age,  she was seizure-free for many years, then had her first convulsion at age 39 with her right arm flexed upward, left arm over her stomach, she spun around then went into a convulsion. Seizure semiology is still the same until now. She also has seizures where she would stare and start screaming "what, what!" from the top of her lungs for 1-4 minutes, the she is confused and starts fiddling with things. One time she was running up and down the street naked and was admitted to inpatient Psychiatry. She has nocturnal seizures where she would start yelling or convulsing with right head deviation. Monica Snow reports sometimes her eyes or mouth jerks even when she is not having a seizure. Monica Snow also reports status epilepticus 7 years ago while living in 09/12/2019 where she was put in an induced coma. She has an average of 10 seizures a week, she sustained a black eye on the right from a seizure last Tuesday. Last seizure was yesterday. She was admitted to [facility] in December 2019 for lethargy due to Depakote toxicity, Depakote level was 111. Per notes, she was having more seizures and her neurologist was increasing Tegretol and Depakote. Her Tegretol had been increased to 400mg  BID and Depakote 750mg  BID. It was recommended the Depakote dose be reduced to 500mg  BID, however her sister was concerned that she was incontinent while on Depakote and did not continue medication. She has only been taking Tegretol 400mg  BID with no side effects (level 07/2019 was 12.1). Her sister reports that she was still having seizures despite being toxic on Depakote. She takes her own medications. She  denies any olfactory/gustatory hallucinations, rising epigastric sensation, focal numbness/tingling/weakness. She has some dizziness prior to the seizure but no other warning symptoms. No headaches, diplopia, dysarthria/dysphagia, neck/back pain, bladder dysfunction. She has occasional constipation. She has been living with her sister for 3  years, her sister reports "she has a problem with authority." She stopped smoking marijuana in November. She has 2 children living in [place]. She is unemployed and does not drive.   Epilepsy Risk Factors:  Their father also had a seizure at 89 months of age then at 5. She was a product of a twin pregnancy and was in special education for a learning disability. There is no history of febrile convulsions, CNS infections such as meningitis/encephalitis, significant traumatic brain injury, neurosurgical procedures.  Prior AEDs: Dilantin  PAST MEDICAL HISTORY: Past Medical History:  Diagnosis Date  . Learning disability   . Seizures (HCC)     MEDICATIONS: Current Outpatient Medications on File Prior to Visit  Medication Sig Dispense Refill  . carbamazepine (TEGRETOL) 200 MG tablet Take 2 tablets twice a day 360 tablet 3  . levETIRAcetam (KEPPRA) 500 MG tablet Take 2 tablets twice a day 360 tablet 3   No current facility-administered medications on file prior to visit.    ALLERGIES: No Known Allergies  FAMILY HISTORY: Family History  Problem Relation Age of Onset  . Brain cancer Mother   . Seizures Father   . Hypertension Father   . Stroke Neg Hx   . Diabetes Neg Hx     SOCIAL HISTORY: Social History   Socioeconomic History  . Marital status: Single    Spouse name: Not on file  . Number of children: Not on file  . Years of education: 8  . Highest education level: Not on file  Occupational History  . Not on file  Tobacco Use  . Smoking status: Current Every Day Smoker    Packs/day: 0.25    Types: Cigarettes  . Smokeless tobacco: Never Used  . Tobacco comment: willing to quit smoking but not right now  Substance and Sexual Activity  . Alcohol use: Never  . Drug use: Never  . Sexual activity: Not on file  Other Topics Concern  . Not on file  Social History Narrative   Right handed      Completed 8th grade      Lives with sister   Social Determinants of  Health   Financial Resource Strain:   . Difficulty of Paying Living Expenses:   Food Insecurity:   . Worried About Programme researcher, broadcasting/film/video in the Last Year:   . Barista in the Last Year:   Transportation Needs:   . Freight forwarder (Medical):   Marland Kitchen Lack of Transportation (Non-Medical):   Physical Activity:   . Days of Exercise per Week:   . Minutes of Exercise per Session:   Stress:   . Feeling of Stress :   Social Connections:   . Frequency of Communication with Friends and Family:   . Frequency of Social Gatherings with Friends and Family:   . Attends Religious Services:   . Active Member of Clubs or Organizations:   . Attends Banker Meetings:   Marland Kitchen Marital Status:   Intimate Partner Violence:   . Fear of Current or Ex-Partner:   . Emotionally Abused:   Marland Kitchen Physically Abused:   . Sexually Abused:     REVIEW OF SYSTEMS: Constitutional: No fevers, chills, or sweats, no generalized  fatigue, change in appetite Eyes: No visual changes, double vision, eye pain Ear, nose and throat: No hearing loss, ear pain, nasal congestion, sore throat Cardiovascular: No chest pain, palpitations Respiratory:  No shortness of breath at rest or with exertion, wheezes GastrointestinaI: No nausea, vomiting, diarrhea, abdominal pain, fecal incontinence Genitourinary:  No dysuria, urinary retention or frequency Musculoskeletal:  No neck pain, back pain Integumentary: No rash, pruritus, skin lesions Neurological: as above Psychiatric: No depression, insomnia, anxiety Endocrine: No palpitations, fatigue, diaphoresis, mood swings, change in appetite, change in weight, increased thirst Hematologic/Lymphatic:  No anemia, purpura, petechiae. Allergic/Immunologic: no itchy/runny eyes, nasal congestion, recent allergic reactions, rashes  PHYSICAL EXAM: Vitals:   03/10/20 0811  BP: 113/77  Pulse: 65  SpO2: 98%   General: No acute distress Head:   Normocephalic/atraumatic Skin/Extremities: No rash, no edema Neurological Exam: alert and oriented to person, place, and time. No aphasia or dysarthria. Fund of knowledge is reduced. Recent and remote memory are impaired. 3/3 delayed recall. Attention and concentration are normal, WORLD spelled as "ROLRD."   Cranial nerves: Pupils equal, round, reactive to light.Extraocular movements intact with no nystagmus. Visual fields full. No facial asymmetry. Motor: Bulk and tone normal, muscle strength 5/5 throughout with no pronator drift.  Deep tendon reflexes 2+ throughout, toes downgoing.  Finger to nose testing intact.  Gait narrow-based and steady, able to tandem walk adequately.     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 EEG showed left hemisphere slowing and frequent spike and polyspike discharges over the left frontocentral region. She has not yet done MRI, reordered today. She is a poor historian but reports seizures have significantly decreased, last seizure was this month. Tolerating medications better, continue Levetiracetam 1000mg  BID and carbamazepine 400mg  BID. She does not drive. No pregnancy plans. Follow-up in 4 months, she knows to call for any changes.   Thank you for allowing me to participate in her care.  Please do not hesitate to call for any questions or concerns.   Ellouise Newer, M.D.   CC: Juluis Mire, NP

## 2020-03-10 NOTE — Patient Instructions (Signed)
1. Schedule MRI brain with and without contrast  2. Continue Keppra 500mg : Take 2 tablets twice a day, and carbamazepine 200mg : take 2 tablets twice a day  3. Follow-up in 4 months, call for any changes  Seizure Precautions: 1. If medication has been prescribed for you to prevent seizures, take it exactly as directed.  Do not stop taking the medicine without talking to your doctor first, even if you have not had a seizure in a long time.   2. Avoid activities in which a seizure would cause danger to yourself or to others.  Don't operate dangerous machinery, swim alone, or climb in high or dangerous places, such as on ladders, roofs, or girders.  Do not drive unless your doctor says you may.  3. If you have any warning that you may have a seizure, lay down in a safe place where you can't hurt yourself.    4.  No driving for 6 months from last seizure, as per [facility].   Please refer to the following link on the Epile[employer] of [employer]'s website for more information: http://www.epilepsyfoundation.org/answerplace/Social/driving/drivingu.cfm   5.  Maintain good sleep hygiene. Avoid alcohol.  6.  Notify your neurology if you are planning pregnancy or if you become pregnant.  7.  Contact your doctor if you have any problems that may be related to the medicine you are taking.  8.  Call 911 and bring the patient back to the ED if:        A.  The seizure lasts longer than 5 minutes.       B.  The patient doesn't awaken shortly after the seizure  C.  The patient has new problems such as difficulty seeing, speaking or moving  D.  The patient was injured during the seizure  E.  The patient has a temperature over 102 F (39C)  F.  The patient vomited and now is having trouble breathing

## 2020-04-07 ENCOUNTER — Ambulatory Visit
Admission: RE | Admit: 2020-04-07 | Discharge: 2020-04-07 | Disposition: A | Discharge status: Home / Self Care | Payer: Medicare Other | Source: Ambulatory Visit | Attending: Neurology | Admitting: Neurology

## 2020-04-07 DIAGNOSIS — G40219 Localization-related (focal) (partial) symptomatic epilepsy and epileptic syndromes with complex partial seizures, intractable, without status epilepticus: Secondary | ICD-10-CM

## 2020-04-07 MED ORDER — GADOBENATE DIMEGLUMINE 529 MG/ML IV SOLN
9.0000 mL | Freq: Once | INTRAVENOUS | Status: AC | PRN
  Administered 2020-04-07: 9 mL via INTRAVENOUS

## 2020-04-11 ENCOUNTER — Telehealth: Payer: Self-pay

## 2020-04-11 NOTE — Telephone Encounter (Signed)
Pt sister called no answer voice mail left for to call back

## 2020-04-11 NOTE — Telephone Encounter (Signed)
-----   Message from Karen M Aquino, MD sent at 04/11/2020 10:25 AM EDT ----- °Pls let patient/sister know the MRI brain did not show any evidence of tumor, stroke, or bleed. Thanks °

## 2020-04-12 ENCOUNTER — Telehealth: Payer: Self-pay

## 2020-04-12 ENCOUNTER — Telehealth: Payer: Self-pay | Admitting: Neurology

## 2020-04-12 NOTE — Telephone Encounter (Signed)
AccessNurse:  "Caller states she is returning a missed call from the office regarding patient."

## 2020-04-12 NOTE — Telephone Encounter (Signed)
Spoke with pt sister and went over results of MRI

## 2020-04-12 NOTE — Telephone Encounter (Signed)
-----   Message from Van Clines, MD sent at 04/11/2020 10:25 AM EDT ----- Pls let patient/sister know the MRI brain did not show any evidence of tumor, stroke, or bleed. Thanks

## 2020-04-12 NOTE — Telephone Encounter (Signed)
Pt called no answer voice mail left for her to call back   

## 2020-04-12 NOTE — Telephone Encounter (Signed)
-----   Message from Karen M Aquino, MD sent at 04/11/2020 10:25 AM EDT ----- °Pls let patient/sister know the MRI brain did not show any evidence of tumor, stroke, or bleed. Thanks °

## 2020-04-17 ENCOUNTER — Other Ambulatory Visit: Payer: Self-pay

## 2020-04-17 MED ORDER — LEVETIRACETAM 500 MG PO TABS: ORAL_TABLET | ORAL | 0 refills | Status: DC

## 2020-04-17 MED ORDER — CARBAMAZEPINE 200 MG PO TABS: ORAL_TABLET | ORAL | 0 refills | Status: DC

## 2020-04-17 NOTE — Telephone Encounter (Signed)
Scripts sent in to [facility] in [place], [place]

## 2020-04-17 NOTE — Telephone Encounter (Signed)
Patient states she "lost" her prescriptions for Tegretol 200 MG and Keppra 500 MG. She said she is in the process of relocating to [place], [place]. Patient requests new prescriptions to be sent to the pharmacy below.  Patient did not have address information to update at this point.  [facility] in [place], [place]

## 2020-06-08 ENCOUNTER — Other Ambulatory Visit: Payer: Self-pay | Admitting: Neurology

## 2020-07-10 ENCOUNTER — Ambulatory Visit: Payer: Medicare Other | Admitting: Neurology

## 2020-07-14 ENCOUNTER — Other Ambulatory Visit: Payer: Self-pay | Admitting: Neurology

## 2020-07-14 NOTE — Telephone Encounter (Signed)
Patient states she is fine. She has a neuro in [place], [place] where she has moved to and she is receiving refills from that office.

## 2020-07-14 NOTE — Telephone Encounter (Signed)
Pls schedule f/u and let her know refills will be sent until her f/u, thanks!

## 2020-07-19 ENCOUNTER — Other Ambulatory Visit: Payer: Self-pay | Admitting: Neurology

## 2020-08-20 ENCOUNTER — Other Ambulatory Visit: Payer: Self-pay | Admitting: Neurology

## 2020-08-21 ENCOUNTER — Other Ambulatory Visit: Payer: Self-pay | Admitting: Neurology

## 2020-10-08 IMAGING — MR MR HEAD WO/W CM
10 of 12 series · 36 of 48 positions shown · IV contrast (multihance)
Comparison: CT head 10/24/2018

CLINICAL DATA: Focal partial symptomatic epilepsy, intractable

EXAM:
MRI HEAD WITHOUT AND WITH CONTRAST
TECHNIQUE: Multiplanar, multiecho pulse sequences of the brain and surrounding
structures were obtained without and with intravenous contrast.
CONTRAST:  9mL MULTIHANCE GADOBENATE DIMEGLUMINE 529 MG/ML IV SOLN

[Series 3: T1 · sagittal · 5.0mm · 0.45mm/px · 3 of 21 slices shown]
[im 1/21]
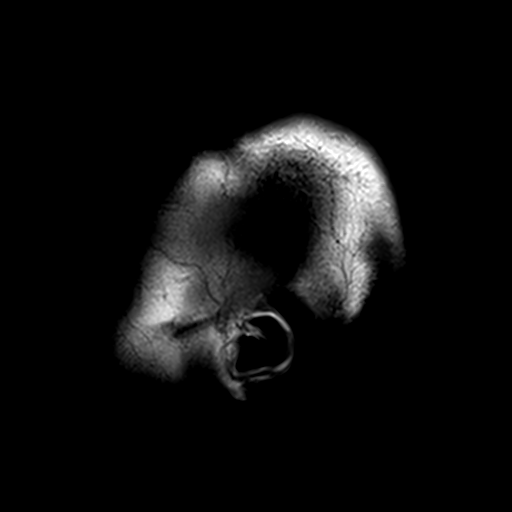
[im 11/21]
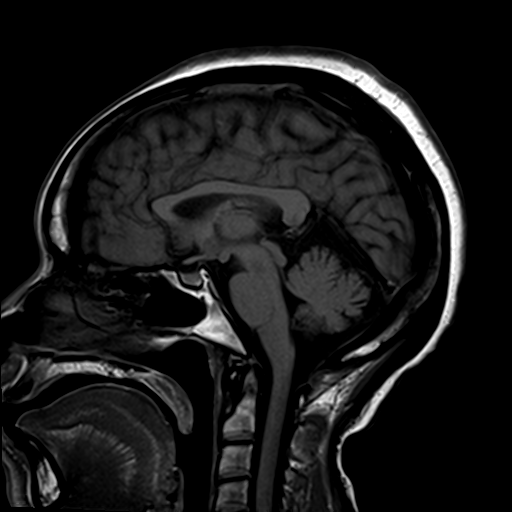
[im 21/21]
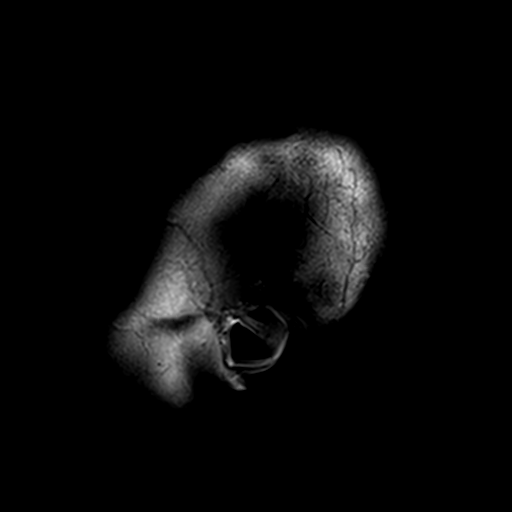

[Series 4: DWI · axial · 3.0mm · 1.80mm/px · z∈[-92,+54]mm · 8 of 100 slices shown (1 of 2)]
[im 1/100]
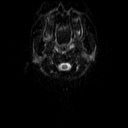
[im 12/100]
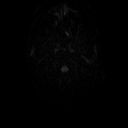
[im 34/100]
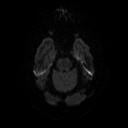
[im 45/100]
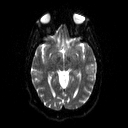
[im 56/100]
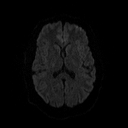
[im 67/100]
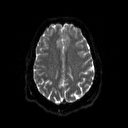
[im 89/100]
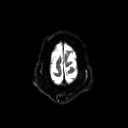
[im 100/100]
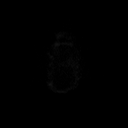

[Series 5: DWI · axial · 3.0mm · 1.80mm/px · z∈[-92,+54]mm · 4 of 50 slices shown (2 of 2)]
[im 1/50]
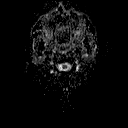
[im 17/50]
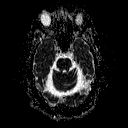
[im 33/50]
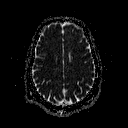
[im 50/50]
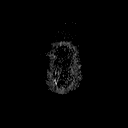

[Series 6: T2 · axial · 5.0mm · 0.51mm/px · z∈[-86,+54]mm · 2 of 22 slices shown (1 of 3)]
[im 1/22]
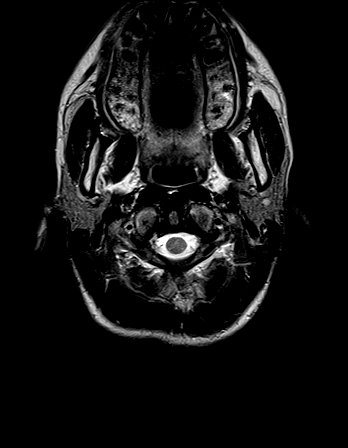
[im 22/22]
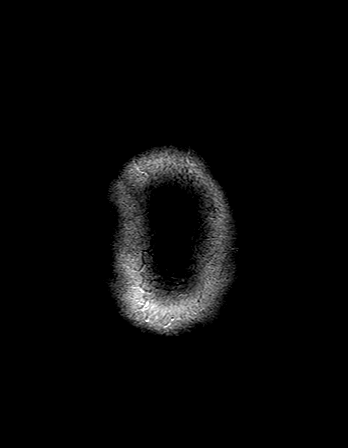

[Series 7: FLAIR · axial · 3.0mm · 0.45mm/px · z∈[-86,+48]mm · 3 of 30 slices shown (1 of 2)]
[im 1/30]
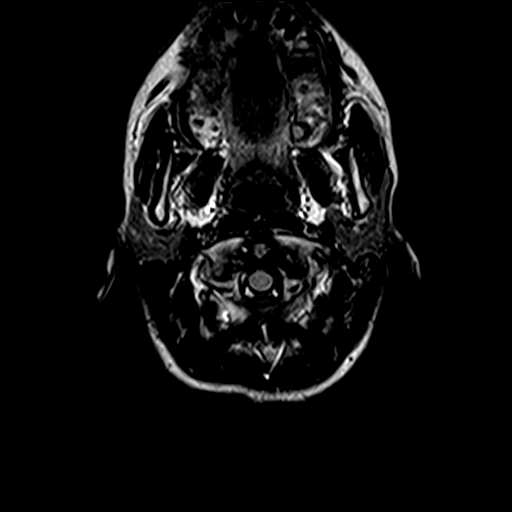
[im 15/30]
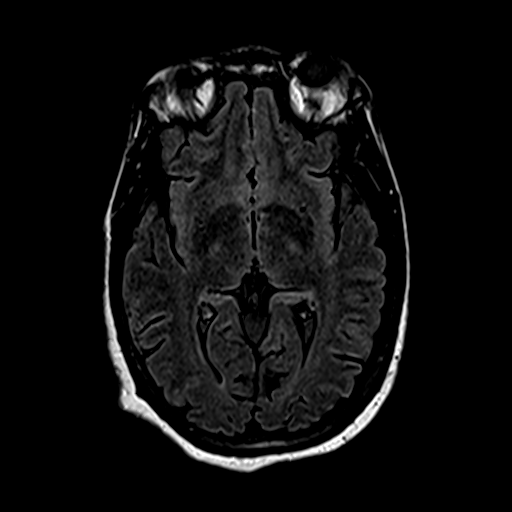
[im 30/30]
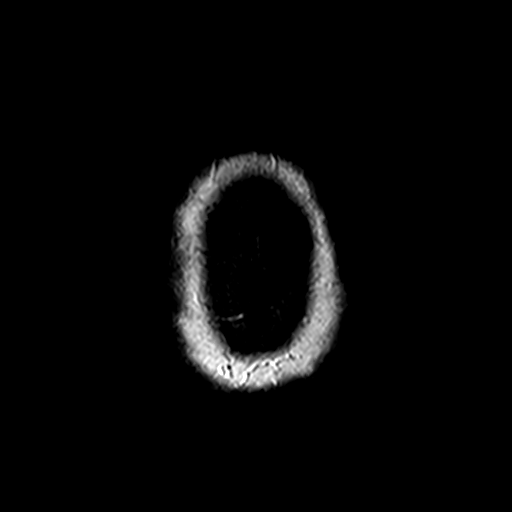

[Series 9: swi_images · axial · 2.0mm · 0.90mm/px · z∈[-89,+52]mm · 6 of 72 slices shown]
[im 1/72]
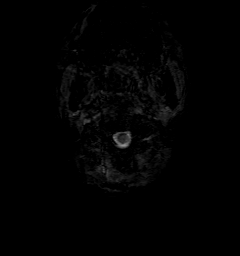
[im 15/72]
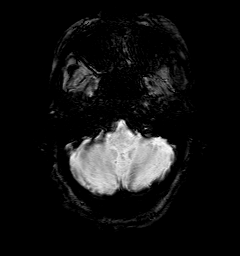
[im 29/72]
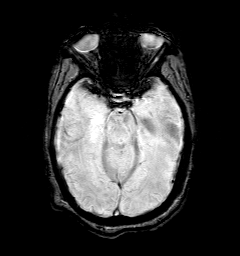
[im 43/72]
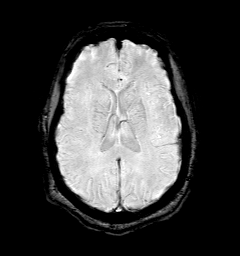
[im 57/72]
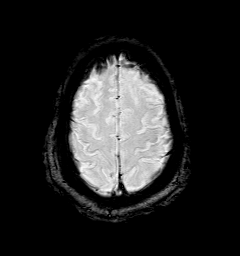
[im 72/72]
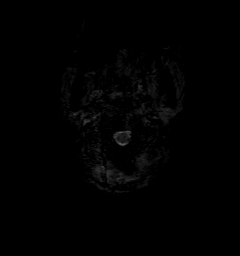

[Series 10: t1_mpr_tra · axial · 2.0mm · 0.45mm/px · z∈[-86,-3]mm · 4 of 72 slices shown]
[im 1/72]
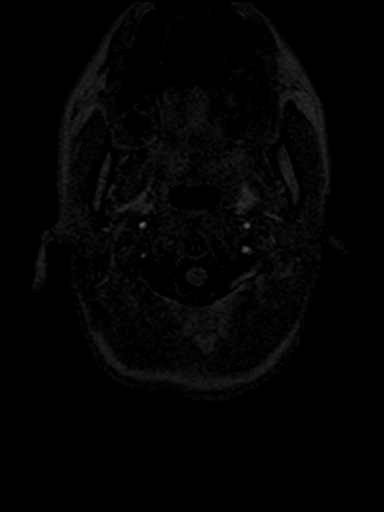
[im 15/72]
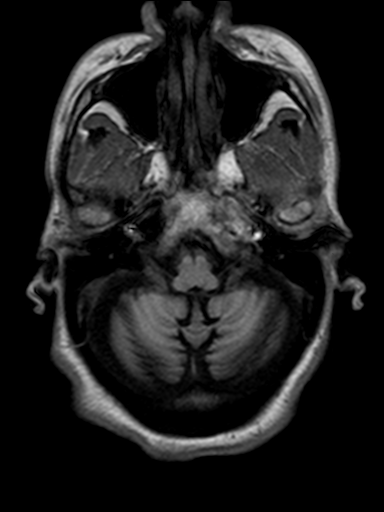
[im 29/72]
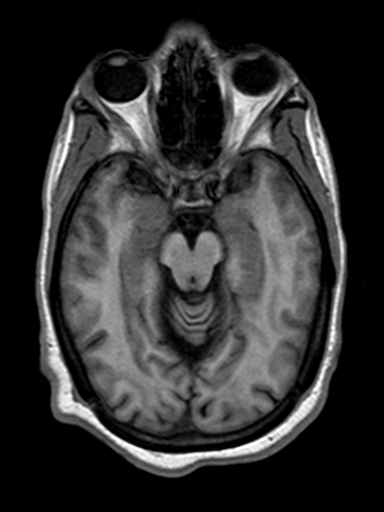
[im 43/72]
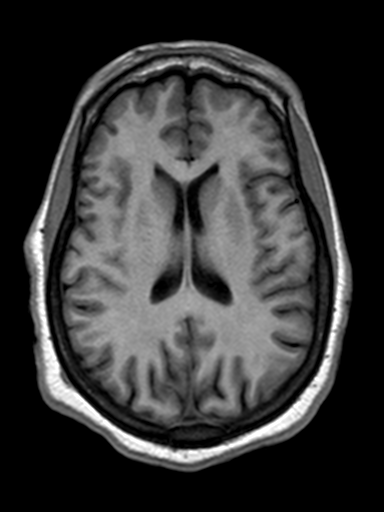

[Series 11: T2 · coronal · 3.0mm · 0.23mm/px · 2 of 28 slices shown (2 of 3)]
[im 1/28]
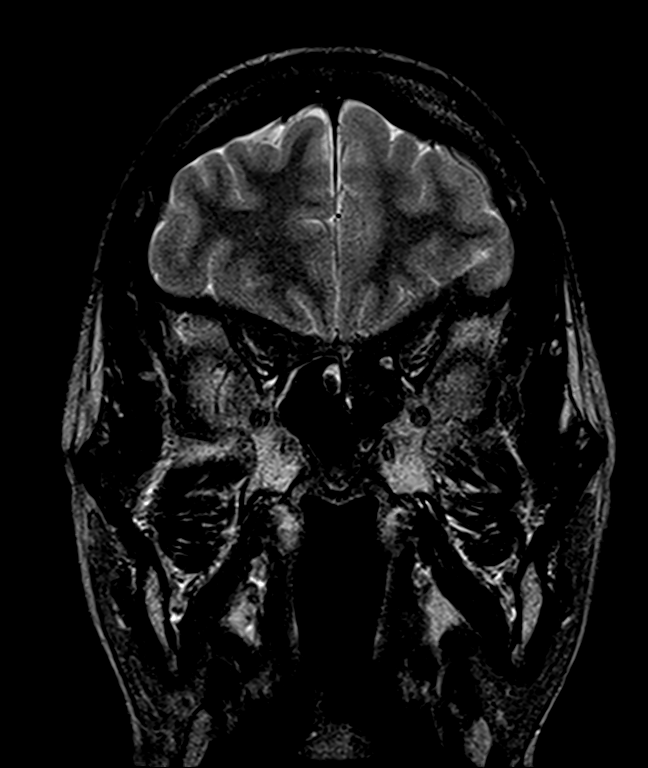
[im 28/28]
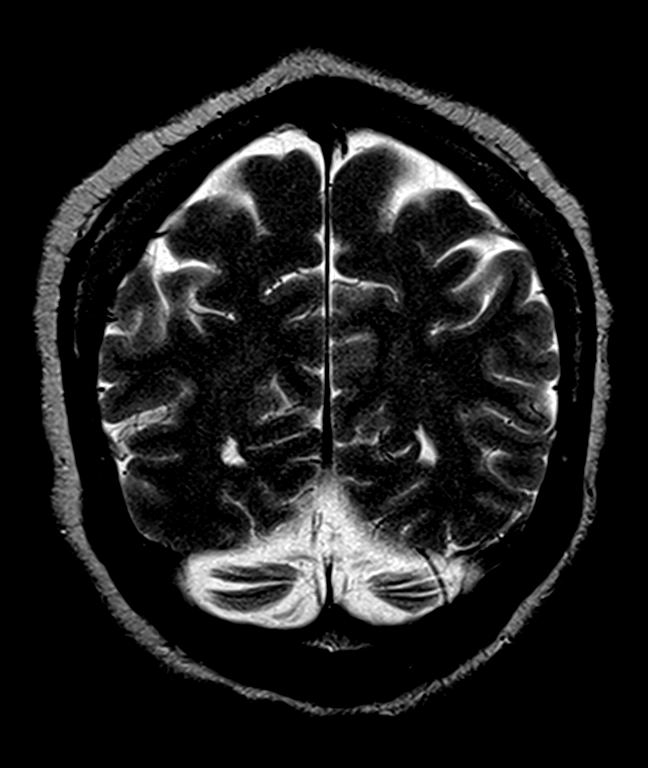

[Series 12: FLAIR · coronal · 3.0mm · 0.70mm/px · 2 of 28 slices shown (2 of 2)]
[im 1/28]
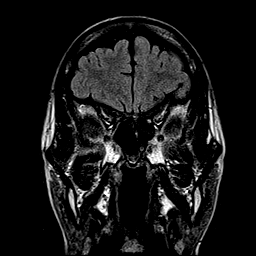
[im 28/28]
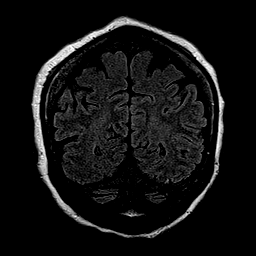

[Series 16: T2 · coronal · 5.0mm · 0.45mm/px · 2 of 25 slices shown (3 of 3)]
[im 1/25]
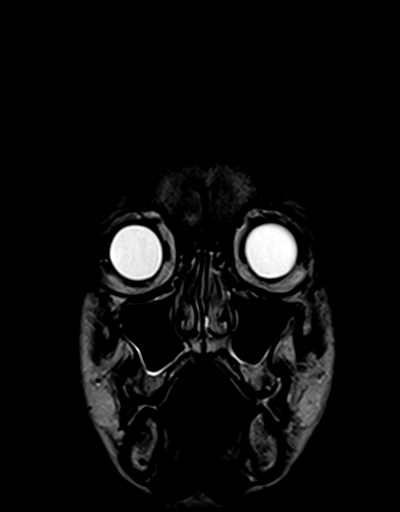
[im 25/25]
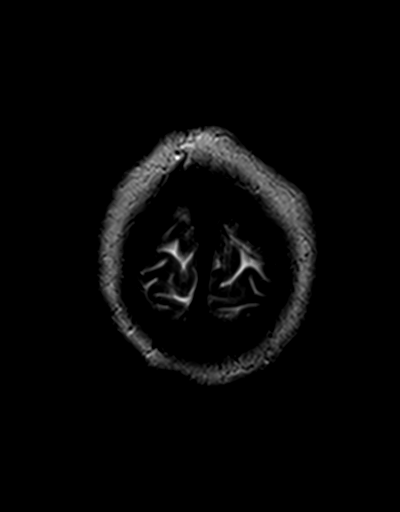

[36 of 48 positions shown; findings below may reference images not displayed]

FINDINGS: Brain: Moderate cerebellar atrophy bilaterally is unchanged from the
CT. No cerebral atrophy. Ventricle size is normal.

Negative for acute or chronic infarct. Negative for hemorrhage or
mass. Normal enhancement

Mesial temporal lobe normal in signal and morphology bilaterally.

Vascular: Normal arterial flow voids

Skull and upper cervical spine: No focal skeletal lesion.

Sinuses/Orbits: Mucosal edema paranasal sinuses.  Negative orbit

Other: None
IMPRESSION: Chronic cerebellar atrophy which may be due to medication for
seizure. Otherwise negative MRI of the brain with contrast. Mesial
temporal lobe normal bilaterally

Sinus mucosal disease.
